# Patient Record
Sex: Female | Born: 1975 | Race: Black or African American | Hispanic: No | Marital: Married | Smoking: Never smoker
Health system: Southern US, Community
[De-identification: ages and names within clinical notes are randomized; demographics above are authoritative.]

## PROBLEM LIST (undated history)

## (undated) DIAGNOSIS — M1990 Unspecified osteoarthritis, unspecified site: Secondary | ICD-10-CM

## (undated) DIAGNOSIS — F32A Depression, unspecified: Secondary | ICD-10-CM

## (undated) HISTORY — DX: Unspecified osteoarthritis, unspecified site: M19.90

## (undated) HISTORY — DX: Depression, unspecified: F32.A

## (undated) HISTORY — PX: APPENDECTOMY: SHX54

## (undated) HISTORY — PX: BREAST SURGERY: SHX581

---

## 2018-11-11 ENCOUNTER — Ambulatory Visit: Payer: Self-pay | Admitting: Family Medicine

## 2018-11-11 LAB — HM PAP SMEAR

## 2019-01-04 ENCOUNTER — Other Ambulatory Visit: Payer: Self-pay

## 2019-01-05 ENCOUNTER — Ambulatory Visit (INDEPENDENT_AMBULATORY_CARE_PROVIDER_SITE_OTHER): Payer: 59 | Admitting: Family Medicine

## 2019-01-05 ENCOUNTER — Encounter: Payer: Self-pay | Admitting: Family Medicine

## 2019-01-05 VITALS — BP 102/78 | HR 88 | Temp 97.9°F | Wt 209.0 lb

## 2019-01-05 DIAGNOSIS — F329 Major depressive disorder, single episode, unspecified: Secondary | ICD-10-CM

## 2019-01-05 DIAGNOSIS — Z0000 Encounter for general adult medical examination without abnormal findings: Secondary | ICD-10-CM

## 2019-01-05 DIAGNOSIS — F419 Anxiety disorder, unspecified: Secondary | ICD-10-CM

## 2019-01-05 DIAGNOSIS — R635 Abnormal weight gain: Secondary | ICD-10-CM | POA: Diagnosis not present

## 2019-01-05 DIAGNOSIS — Z13220 Encounter for screening for lipoid disorders: Secondary | ICD-10-CM

## 2019-01-05 DIAGNOSIS — F32A Depression, unspecified: Secondary | ICD-10-CM | POA: Insufficient documentation

## 2019-01-05 LAB — BASIC METABOLIC PANEL
BUN: 10 mg/dL (ref 6–23)
CO2: 28 mEq/L (ref 19–32)
Calcium: 9.2 mg/dL (ref 8.4–10.5)
Chloride: 103 mEq/L (ref 96–112)
Creatinine, Ser: 0.74 mg/dL (ref 0.40–1.20)
GFR: 103.04 mL/min (ref >60.00)
Glucose, Bld: 81 mg/dL (ref 70–99)
Potassium: 3.9 mEq/L (ref 3.5–5.1)
Sodium: 138 mEq/L (ref 135–145)

## 2019-01-05 LAB — CBC WITH DIFFERENTIAL/PLATELET
Basophils Absolute: 0 10*3/uL (ref 0.0–0.1)
Basophils Relative: 0.6 % (ref 0.0–3.0)
Eosinophils Absolute: 0.1 10*3/uL (ref 0.0–0.7)
Eosinophils Relative: 2.2 % (ref 0.0–5.0)
HCT: 43.3 % (ref 36.0–46.0)
Hemoglobin: 14.5 g/dL (ref 12.0–15.0)
Lymphocytes Relative: 28.9 % (ref 12.0–46.0)
Lymphs Abs: 1.7 10*3/uL (ref 0.7–4.0)
MCHC: 33.4 g/dL (ref 30.0–36.0)
MCV: 95.3 fl (ref 78.0–100.0)
Monocytes Absolute: 0.4 10*3/uL (ref 0.1–1.0)
Monocytes Relative: 6.7 % (ref 3.0–12.0)
Neutro Abs: 3.6 10*3/uL (ref 1.4–7.7)
Neutrophils Relative %: 61.6 % (ref 43.0–77.0)
Platelets: 270 10*3/uL (ref 150.0–400.0)
RBC: 4.55 Mil/uL (ref 3.87–5.11)
RDW: 13.5 % (ref 11.5–15.5)
WBC: 5.9 10*3/uL (ref 4.0–10.5)

## 2019-01-05 LAB — HEMOGLOBIN A1C: Hgb A1c MFr Bld: 5.2 % (ref 4.6–6.5)

## 2019-01-05 LAB — LIPID PANEL
Cholesterol: 191 mg/dL (ref 0–200)
HDL: 70.3 mg/dL (ref >39.00)
LDL Cholesterol: 95 mg/dL (ref 0–99)
NonHDL: 120.76
Total CHOL/HDL Ratio: 3
Triglycerides: 127 mg/dL (ref 0.0–149.0)
VLDL: 25.4 mg/dL (ref 0.0–40.0)

## 2019-01-05 LAB — T4, FREE: Free T4: 0.87 ng/dL (ref 0.60–1.60)

## 2019-01-05 LAB — TSH: TSH: 2.14 u[IU]/mL (ref 0.35–4.50)

## 2019-01-05 NOTE — Patient Instructions (Signed)
Preventive Care 40-44 Years Old, Female Preventive care refers to visits with your health care provider and lifestyle choices that can promote health and wellness. This includes:  A yearly physical exam. This may also be called an annual well check.  Regular dental visits and eye exams.  Immunizations.  Screening for certain conditions.  Healthy lifestyle choices, such as eating a healthy diet, getting regular exercise, not using drugs or products that contain nicotine and tobacco, and limiting alcohol use. What can I expect for my preventive care visit? Physical exam Your health care provider will check your:  Height and weight. This may be used to calculate body mass index (BMI), which tells if you are at a healthy weight.  Heart rate and blood pressure.  Skin for abnormal spots. Counseling Your health care provider may ask you questions about your:  Alcohol, tobacco, and drug use.  Emotional well-being.  Home and relationship well-being.  Sexual activity.  Eating habits.  Work and work environment.  Method of birth control.  Menstrual cycle.  Pregnancy history. What immunizations do I need?  Influenza (flu) vaccine  This is recommended every year. Tetanus, diphtheria, and pertussis (Tdap) vaccine  You may need a Td booster every 10 years. Varicella (chickenpox) vaccine  You may need this if you have not been vaccinated. Zoster (shingles) vaccine  You may need this after age 60. Measles, mumps, and rubella (MMR) vaccine  You may need at least one dose of MMR if you were born in 1958 or later. You may also need a second dose. Pneumococcal conjugate (PCV13) vaccine  You may need this if you have certain conditions and were not previously vaccinated. Pneumococcal polysaccharide (PPSV23) vaccine  You may need one or two doses if you smoke cigarettes or if you have certain conditions. Meningococcal conjugate (MenACWY) vaccine  You may need this if you  have certain conditions. Hepatitis A vaccine  You may need this if you have certain conditions or if you travel or work in places where you may be exposed to hepatitis A. Hepatitis B vaccine  You may need this if you have certain conditions or if you travel or work in places where you may be exposed to hepatitis B. Haemophilus influenzae type b (Hib) vaccine  You may need this if you have certain conditions. Human papillomavirus (HPV) vaccine  If recommended by your health care provider, you may need three doses over 6 months. You may receive vaccines as individual doses or as more than one vaccine together in one shot (combination vaccines). Talk with your health care provider about the risks and benefits of combination vaccines. What tests do I need? Blood tests  Lipid and cholesterol levels. These may be checked every 5 years, or more frequently if you are over 50 years old.  Hepatitis C test.  Hepatitis B test. Screening  Lung cancer screening. You may have this screening every year starting at age 55 if you have a 30-pack-year history of smoking and currently smoke or have quit within the past 15 years.  Colorectal cancer screening. All adults should have this screening starting at age 50 and continuing until age 75. Your health care provider may recommend screening at age 45 if you are at increased risk. You will have tests every 1-10 years, depending on your results and the type of screening test.  Diabetes screening. This is done by checking your blood sugar (glucose) after you have not eaten for a while (fasting). You may have this   done every 1-3 years.  Mammogram. This may be done every 1-2 years. Talk with your health care provider about when you should start having regular mammograms. This may depend on whether you have a family history of breast cancer.  BRCA-related cancer screening. This may be done if you have a family history of breast, ovarian, tubal, or peritoneal  cancers.  Pelvic exam and Pap test. This may be done every 3 years starting at age 70. Starting at age 54, this may be done every 5 years if you have a Pap test in combination with an HPV test. Other tests  Sexually transmitted disease (STD) testing.  Bone density scan. This is done to screen for osteoporosis. You may have this scan if you are at high risk for osteoporosis. Follow these instructions at home: Eating and drinking  Eat a diet that includes fresh fruits and vegetables, whole grains, lean protein, and low-fat dairy.  Take vitamin and mineral supplements as recommended by your health care provider.  Do not drink alcohol if: ? Your health care provider tells you not to drink. ? You are pregnant, may be pregnant, or are planning to become pregnant.  If you drink alcohol: ? Limit how much you have to 0-1 drink a day. ? Be aware of how much alcohol is in your drink. In the [place] one drink equals one 12 oz bottle of beer (355 mL), one 5 oz glass of wine (148 mL), or one 1 oz glass of hard liquor (44 mL). Lifestyle  Take daily care of your teeth and gums.  Stay active. Exercise for at least 30 minutes on 5 or more days each week.  Do not use any products that contain nicotine or tobacco, such as cigarettes, e-cigarettes, and chewing tobacco. If you need help quitting, ask your health care provider.  If you are sexually active, practice safe sex. Use a condom or other form of birth control (contraception) in order to prevent pregnancy and STIs (sexually transmitted infections).  If told by your health care provider, take low-dose aspirin daily starting at age 80. What's next?  Visit your health care provider once a year for a well check visit.  Ask your health care provider how often you should have your eyes and teeth checked.  Stay up to date on all vaccines. This information is not intended to replace advice given to you by your health care provider. Make sure you  discuss any questions you have with your health care provider. Document Released: 02/02/2015 Document Revised: 09/17/2017 Document Reviewed: 09/17/2017 Elsevier Patient Education  2020 [place]  After being diagnosed with an anxiety disorder, you may be relieved to know why you have felt or behaved a certain way. It is natural to also feel overwhelmed about the treatment ahead and what it will mean for your life. With care and support, you can manage this condition and recover from it. How to cope with anxiety Dealing with stress Stress is your body's reaction to life changes and events, both good and bad. Stress can last just a few hours or it can be ongoing. Stress can play a major role in anxiety, so it is important to learn both how to cope with stress and how to think about it differently. Talk with your health care provider or a counselor to learn more about stress reduction. He or she may suggest some stress reduction techniques, such as:  Music therapy. This can include creating or listening  to music that you enjoy and that inspires you.  Mindfulness-based meditation. This involves being aware of your normal breaths, rather than trying to control your breathing. It can be done while sitting or walking.  Centering prayer. This is a kind of meditation that involves focusing on a word, phrase, or sacred image that is meaningful to you and that brings you peace.  Deep breathing. To do this, expand your stomach and inhale slowly through your nose. Hold your breath for 3-5 seconds. Then exhale slowly, allowing your stomach muscles to relax.  Self-talk. This is a skill where you identify thought patterns that lead to anxiety reactions and correct those thoughts.  Muscle relaxation. This involves tensing muscles then relaxing them. Choose a stress reduction technique that fits your lifestyle and personality. Stress reduction techniques take time and practice. Set  aside 5-15 minutes a day to do them. Therapists can offer training in these techniques. The training may be covered by some insurance plans. Other things you can do to manage stress include:  Keeping a stress diary. This can help you learn what triggers your stress and ways to control your response.  Thinking about how you respond to certain situations. You may not be able to control everything, but you can control your reaction.  Making time for activities that help you relax, and not feeling guilty about spending your time in this way. Therapy combined with coping and stress-reduction skills provides the best chance for successful treatment. Medicines Medicines can help ease symptoms. Medicines for anxiety include:  Anti-anxiety drugs.  Antidepressants.  Beta-blockers. Medicines may be used as the main treatment for anxiety disorder, along with therapy, or if other treatments are not working. Medicines should be prescribed by a health care provider. Relationships Relationships can play a big part in helping you recover. Try to spend more time connecting with trusted friends and family members. Consider going to couples counseling, taking family education classes, or going to family therapy. Therapy can help you and others better understand the condition. How to recognize changes in your condition Everyone has a different response to treatment for anxiety. Recovery from anxiety happens when symptoms decrease and stop interfering with your daily activities at home or work. This may mean that you will start to:  Have better concentration and focus.  Sleep better.  Be less irritable.  Have more energy.  Have improved memory. It is important to recognize when your condition is getting worse. Contact your health care provider if your symptoms interfere with home or work and you do not feel like your condition is improving. Where to find help and support: You can get help and support from  these sources:  Self-help groups.  Online and [employer].  A trusted spiritual leader.  Couples counseling.  Family education classes.  Family therapy. Follow these instructions at home:  Eat a healthy diet that includes plenty of vegetables, fruits, whole grains, low-fat dairy products, and lean protein. Do not eat a lot of foods that are high in solid fats, added sugars, or salt.  Exercise. Most adults should do the following: ? Exercise for at least 150 minutes each week. The exercise should increase your heart rate and make you sweat (moderate-intensity exercise). ? Strengthening exercises at least twice a week.  Cut down on caffeine, tobacco, alcohol, and other potentially harmful substances.  Get the right amount and quality of sleep. Most adults need 7-9 hours of sleep each night.  Make choices that simplify your life.  Take over-the-counter and prescription medicines only as told by your health care provider.  Avoid caffeine, alcohol, and certain over-the-counter cold medicines. These may make you feel worse. Ask your pharmacist which medicines to avoid.  Keep all follow-up visits as told by your health care provider. This is important. Questions to ask your health care provider  Would I benefit from therapy?  How often should I follow up with a health care provider?  How long do I need to take medicine?  Are there any long-term side effects of my medicine?  Are there any alternatives to taking medicine? Contact a health care provider if:  You have a hard time staying focused or finishing daily tasks.  You spend many hours a day feeling worried about everyday life.  You become exhausted by worry.  You start to have headaches, feel tense, or have nausea.  You urinate more than normal.  You have diarrhea. Get help right away if:  You have a racing heart and shortness of breath.  You have thoughts of hurting yourself or others. If you  ever feel like you may hurt yourself or others, or have thoughts about taking your own life, get help right away. You can go to your nearest emergency department or call:  Your local emergency services (911 in the [place]  A suicide crisis helpline, such as the [place] at (778)460-1154. This is open 24-hours a day. Summary  Taking steps to deal with stress can help calm you.  Medicines cannot cure anxiety disorders, but they can help ease symptoms.  Family, friends, and partners can play a big part in helping you recover from an anxiety disorder. This information is not intended to replace advice given to you by your health care provider. Make sure you discuss any questions you have with your health care provider. Document Released: 01/01/2016 Document Revised: 12/19/2016 Document Reviewed: 01/01/2016 Elsevier Patient Education  2020 [place] With Depression Everyone experiences occasional disappointment, sadness, and loss in their lives. When you are feeling down, blue, or sad for at least 2 weeks in a row, it may mean that you have depression. Depression can affect your thoughts and feelings, relationships, daily activities, and physical health. It is caused by changes in the way your brain functions. If you receive a diagnosis of depression, your health care provider will tell you which type of depression you have and what treatment options are available to you. If you are living with depression, there are ways to help you recover from it and also ways to prevent it from coming back. How to cope with lifestyle changes Coping with stress     Stress is your body's reaction to life changes and events, both good and bad. Stressful situations may include:  Getting married.  The death of a spouse.  Losing a job.  Retiring.  Having a baby. Stress can last just a few hours or it can be ongoing. Stress can play a major role in depression, so it  is important to learn both how to cope with stress and how to think about it differently. Talk with your health care provider or a counselor if you would like to learn more about stress reduction. He or she may suggest some stress reduction techniques, such as:  Music therapy. This can include creating music or listening to music. Choose music that you enjoy and that inspires you.  Mindfulness-based meditation. This kind of meditation can be done while sitting or  walking. It involves being aware of your normal breaths, rather than trying to control your breathing.  Centering prayer. This is a kind of meditation that involves focusing on a spiritual word or phrase. Choose a word, phrase, or sacred image that is meaningful to you and that brings you peace.  Deep breathing. To do this, expand your stomach and inhale slowly through your nose. Hold your breath for 3-5 seconds, then exhale slowly, allowing your stomach muscles to relax.  Muscle relaxation. This involves intentionally tensing muscles then relaxing them. Choose a stress reduction technique that fits your lifestyle and personality. Stress reduction techniques take time and practice to develop. Set aside 5-15 minutes a day to do them. Therapists can offer training in these techniques. The training may be covered by some insurance plans. Other things you can do to manage stress include:  Keeping a stress diary. This can help you learn what triggers your stress and ways to control your response.  Understanding what your limits are and saying no to requests or events that lead to a schedule that is too full.  Thinking about how you respond to certain situations. You may not be able to control everything, but you can control how you react.  Adding humor to your life by watching funny films or TV shows.  Making time for activities that help you relax and not feeling guilty about spending your time this way.  Medicines Your health care  provider may suggest certain medicines if he or she feels that they will help improve your condition. Avoid using alcohol and other substances that may prevent your medicines from working properly (may interact). It is also important to:  Talk with your pharmacist or health care provider about all the medicines that you take, their possible side effects, and what medicines are safe to take together.  Make it your goal to take part in all treatment decisions (shared decision-making). This includes giving input on the side effects of medicines. It is best if shared decision-making with your health care provider is part of your total treatment plan. If your health care provider prescribes a medicine, you may not notice the full benefits of it for 4-8 weeks. Most people who are treated for depression need to be on medicine for at least 6-12 months after they feel better. If you are taking medicines as part of your treatment, do not stop taking medicines without first talking to your health care provider. You may need to have the medicine slowly decreased (tapered) over time to decrease the risk of harmful side effects. Relationships Your health care provider may suggest family therapy along with individual therapy and drug therapy. While there may not be family problems that are causing you to feel depressed, it is still important to make sure your family learns as much as they can about your mental health. Having your family's support can help make your treatment successful. How to recognize changes in your condition Everyone has a different response to treatment for depression. Recovery from major depression happens when you have not had signs of major depression for two months. This may mean that you will start to:  Have more interest in doing activities.  Feel less hopeless than you did 2 months ago.  Have more energy.  Overeat less often, or have better or improving appetite.  Have better  concentration. Your health care provider will work with you to decide the next steps in your recovery. It is also important to recognize  when your condition is getting worse. Watch for these signs:  Having fatigue or low energy.  Eating too much or too little.  Sleeping too much or too little.  Feeling restless, agitated, or hopeless.  Having trouble concentrating or making decisions.  Having unexplained physical complaints.  Feeling irritable, angry, or aggressive. Get help as soon as you or your family members notice these symptoms coming back. How to get support and help from others How to talk with friends and family members about your condition  Talking to friends and family members about your condition can provide you with one way to get support and guidance. Reach out to trusted friends or family members, explain your symptoms to them, and let them know that you are working with a health care provider to treat your depression. Financial resources Not all insurance plans cover mental health care, so it is important to check with your insurance carrier. If paying for co-pays or counseling services is a problem, search for a local or county mental health care center. They may be able to offer public mental health care services at low or no cost when you are not able to see a private health care provider. If you are taking medicine for depression, you may be able to get the generic form, which may be less expensive. Some makers of prescription medicines also offer help to patients who cannot afford the medicines they need. Follow these instructions at home:   Get the right amount and quality of sleep.  Cut down on using caffeine, tobacco, alcohol, and other potentially harmful substances.  Try to exercise, such as walking or lifting small weights.  Take over-the-counter and prescription medicines only as told by your health care provider.  Eat a healthy diet that includes plenty  of vegetables, fruits, whole grains, low-fat dairy products, and lean protein. Do not eat a lot of foods that are high in solid fats, added sugars, or salt.  Keep all follow-up visits as told by your health care provider. This is important. Contact a health care provider if:  You stop taking your antidepressant medicines, and you have any of these symptoms: ? Nausea. ? Headache. ? Feeling lightheaded. ? Chills and body aches. ? Not being able to sleep (insomnia).  You or your friends and family think your depression is getting worse. Get help right away if:  You have thoughts of hurting yourself or others. If you ever feel like you may hurt yourself or others, or have thoughts about taking your own life, get help right away. You can go to your nearest emergency department or call:  Your local emergency services (911 in the [place]  A suicide crisis helpline, such as the [place] at 870-418-5797. This is open 24-hours a day. Summary  If you are living with depression, there are ways to help you recover from it and also ways to prevent it from coming back.  Work with your health care team to create a management plan that includes counseling, stress management techniques, and healthy lifestyle habits. This information is not intended to replace advice given to you by your health care provider. Make sure you discuss any questions you have with your health care provider. Document Released: 12/10/2015 Document Revised: 04/30/2018 Document Reviewed: 12/10/2015 Elsevier Patient Education  2020 [place] for [employer] Calories are units of energy. Your body needs a certain amount of calories from food to keep you going throughout the day. When  you eat more calories than your body needs, your body stores the extra calories as fat. When you eat fewer calories than your body needs, your body burns fat to get the energy it needs. Calorie  counting means keeping track of how many calories you eat and drink each day. Calorie counting can be helpful if you need to lose weight. If you make sure to eat fewer calories than your body needs, you should lose weight. Ask your health care provider what a healthy weight is for you. For calorie counting to work, you will need to eat the right number of calories in a day in order to lose a healthy amount of weight per week. A dietitian can help you determine how many calories you need in a day and will give you suggestions on how to reach your calorie goal.  A healthy amount of weight to lose per week is usually 1-2 lb (0.5-0.9 kg). This usually means that your daily calorie intake should be reduced by 500-750 calories.  Eating 1,200 - 1,500 calories per day can help most women lose weight.  Eating 1,500 - 1,800 calories per day can help most men lose weight. What is my plan? My goal is to have __________ calories per day. If I have this many calories per day, I should lose around __________ pounds per week. What do I need to know about calorie counting? In order to meet your daily calorie goal, you will need to:  Find out how many calories are in each food you would like to eat. Try to do this before you eat.  Decide how much of the food you plan to eat.  Write down what you ate and how many calories it had. Doing this is called keeping a food log. To successfully lose weight, it is important to balance calorie counting with a healthy lifestyle that includes regular activity. Aim for 150 minutes of moderate exercise (such as walking) or 75 minutes of vigorous exercise (such as running) each week. Where do I find calorie information?  The number of calories in a food can be found on a Nutrition Facts label. If a food does not have a Nutrition Facts label, try to look up the calories online or ask your dietitian for help. Remember that calories are listed per serving. If you choose to have  more than one serving of a food, you will have to multiply the calories per serving by the amount of servings you plan to eat. For example, the label on a package of bread might say that a serving size is 1 slice and that there are 90 calories in a serving. If you eat 1 slice, you will have eaten 90 calories. If you eat 2 slices, you will have eaten 180 calories. How do I keep a food log? Immediately after each meal, record the following information in your food log:  What you ate. Don't forget to include toppings, sauces, and other extras on the food.  How much you ate. This can be measured in cups, ounces, or number of items.  How many calories each food and drink had.  The total number of calories in the meal. Keep your food log near you, such as in a small notebook in your pocket, or use a mobile app or website. Some programs will calculate calories for you and show you how many calories you have left for the day to meet your goal. What are some calorie counting tips?  Use your calories on foods and drinks that will fill you up and not leave you hungry: ? Some examples of foods that fill you up are nuts and nut butters, vegetables, lean proteins, and high-fiber foods like whole grains. High-fiber foods are foods with more than 5 g fiber per serving. ? Drinks such as sodas, specialty coffee drinks, alcohol, and juices have a lot of calories, yet do not fill you up.  Eat nutritious foods and avoid empty calories. Empty calories are calories you get from foods or beverages that do not have many vitamins or protein, such as candy, sweets, and soda. It is better to have a nutritious high-calorie food (such as an avocado) than a food with few nutrients (such as a bag of chips).  Know how many calories are in the foods you eat most often. This will help you calculate calorie counts faster.  Pay attention to calories in drinks. Low-calorie drinks include water and unsweetened drinks.  Pay  attention to nutrition labels for "low fat" or "fat free" foods. These foods sometimes have the same amount of calories or more calories than the full fat versions. They also often have added sugar, starch, or salt, to make up for flavor that was removed with the fat.  Find a way of tracking calories that works for you. Get creative. Try different apps or programs if writing down calories does not work for you. What are some portion control tips?  Know how many calories are in a serving. This will help you know how many servings of a certain food you can have.  Use a measuring cup to measure serving sizes. You could also try weighing out portions on a kitchen scale. With time, you will be able to estimate serving sizes for some foods.  Take some time to put servings of different foods on your favorite plates, bowls, and cups so you know what a serving looks like.  Try not to eat straight from a bag or box. Doing this can lead to overeating. Put the amount you would like to eat in a cup or on a plate to make sure you are eating the right portion.  Use smaller plates, glasses, and bowls to prevent overeating.  Try not to multitask (for example, watch TV or use your computer) while eating. If it is time to eat, sit down at a table and enjoy your food. This will help you to know when you are full. It will also help you to be aware of what you are eating and how much you are eating. What are tips for following this plan? Reading food labels  Check the calorie count compared to the serving size. The serving size may be smaller than what you are used to eating.  Check the source of the calories. Make sure the food you are eating is high in vitamins and protein and low in saturated and trans fats. Shopping  Read nutrition labels while you shop. This will help you make healthy decisions before you decide to purchase your food.  Make a grocery list and stick to it. Cooking  Try to cook your  favorite foods in a healthier way. For example, try baking instead of frying.  Use low-fat dairy products. Meal planning  Use more fruits and vegetables. Half of your plate should be fruits and vegetables.  Include lean proteins like poultry and fish. How do I count calories when eating out?  Ask for smaller portion sizes.  Consider sharing  an entree and sides instead of getting your own entree.  If you get your own entree, eat only half. Ask for a box at the beginning of your meal and put the rest of your entree in it so you are not tempted to eat it.  If calories are listed on the menu, choose the lower calorie options.  Choose dishes that include vegetables, fruits, whole grains, low-fat dairy products, and lean protein.  Choose items that are boiled, broiled, grilled, or steamed. Stay away from items that are buttered, battered, fried, or served with cream sauce. Items labeled "crispy" are usually fried, unless stated otherwise.  Choose water, low-fat milk, unsweetened iced tea, or other drinks without added sugar. If you want an alcoholic beverage, choose a lower calorie option such as a glass of wine or light beer.  Ask for dressings, sauces, and syrups on the side. These are usually high in calories, so you should limit the amount you eat.  If you want a salad, choose a garden salad and ask for grilled meats. Avoid extra toppings like bacon, cheese, or fried items. Ask for the dressing on the side, or ask for olive oil and vinegar or lemon to use as dressing.  Estimate how many servings of a food you are given. For example, a serving of cooked rice is  cup or about the size of half a baseball. Knowing serving sizes will help you be aware of how much food you are eating at restaurants. The list below tells you how big or small some common portion sizes are based on everyday objects: ? 1 oz--4 stacked dice. ? 3 oz--1 deck of cards. ? 1 tsp--1 die. ? 1 Tbsp-- a ping-pong  ball. ? 2 Tbsp--1 ping-pong ball. ?  cup-- baseball. ? 1 cup--1 baseball. Summary  Calorie counting means keeping track of how many calories you eat and drink each day. If you eat fewer calories than your body needs, you should lose weight.  A healthy amount of weight to lose per week is usually 1-2 lb (0.5-0.9 kg). This usually means reducing your daily calorie intake by 500-750 calories.  The number of calories in a food can be found on a Nutrition Facts label. If a food does not have a Nutrition Facts label, try to look up the calories online or ask your dietitian for help.  Use your calories on foods and drinks that will fill you up, and not on foods and drinks that will leave you hungry.  Use smaller plates, glasses, and bowls to prevent overeating. This information is not intended to replace advice given to you by your health care provider. Make sure you discuss any questions you have with your health care provider. Document Released: 01/06/2005 Document Revised: 09/25/2017 Document Reviewed: 12/07/2015 Elsevier Patient Education  2020 [employer].  Exercising to Lose Weight Exercise is structured, repetitive physical activity to improve fitness and health. Getting regular exercise is important for everyone. It is especially important if you are overweight. Being overweight increases your risk of heart disease, stroke, diabetes, high blood pressure, and several types of cancer. Reducing your calorie intake and exercising can help you lose weight. Exercise is usually categorized as moderate or vigorous intensity. To lose weight, most people need to do a certain amount of moderate-intensity or vigorous-intensity exercise each week. Moderate-intensity exercise  Moderate-intensity exercise is any activity that gets you moving enough to burn at least three times more energy (calories) than if you were sitting. Examples of  moderate exercise include:  Walking a mile in 15  minutes.  Doing light yard work.  Biking at an easy pace. Most people should get at least 150 minutes (2 hours and 30 minutes) a week of moderate-intensity exercise to maintain their body weight. Vigorous-intensity exercise Vigorous-intensity exercise is any activity that gets you moving enough to burn at least six times more calories than if you were sitting. When you exercise at this intensity, you should be working hard enough that you are not able to carry on a conversation. Examples of vigorous exercise include:  Running.  Playing a team sport, such as football, basketball, and soccer.  Jumping rope. Most people should get at least 75 minutes (1 hour and 15 minutes) a week of vigorous-intensity exercise to maintain their body weight. How can exercise affect me? When you exercise enough to burn more calories than you eat, you lose weight. Exercise also reduces body fat and builds muscle. The more muscle you have, the more calories you burn. Exercise also:  Improves mood.  Reduces stress and tension.  Improves your overall fitness, flexibility, and endurance.  Increases bone strength. The amount of exercise you need to lose weight depends on:  Your age.  The type of exercise.  Any health conditions you have.  Your overall physical ability. Talk to your health care provider about how much exercise you need and what types of activities are safe for you. What actions can I take to lose weight? Nutrition   Make changes to your diet as told by your health care provider or diet and nutrition specialist (dietitian). This may include: ? Eating fewer calories. ? Eating more protein. ? Eating less unhealthy fats. ? Eating a diet that includes fresh fruits and vegetables, whole grains, low-fat dairy products, and lean protein. ? Avoiding foods with added fat, salt, and sugar.  Drink plenty of water while you exercise to prevent dehydration or heat stroke. Activity  Choose an  activity that you enjoy and set realistic goals. Your health care provider can help you make an exercise plan that works for you.  Exercise at a moderate or vigorous intensity most days of the week. ? The intensity of exercise may vary from person to person. You can tell how intense a workout is for you by paying attention to your breathing and heartbeat. Most people will notice their breathing and heartbeat get faster with more intense exercise.  Do resistance training twice each week, such as: ? Push-ups. ? Sit-ups. ? Lifting weights. ? Using resistance bands.  Getting short amounts of exercise can be just as helpful as long structured periods of exercise. If you have trouble finding time to exercise, try to include exercise in your daily routine. ? Get up, stretch, and walk around every 30 minutes throughout the day. ? Go for a walk during your lunch break. ? Park your car farther away from your destination. ? If you take public transportation, get off one stop early and walk the rest of the way. ? Make phone calls while standing up and walking around. ? Take the stairs instead of elevators or escalators.  Wear comfortable clothes and shoes with good support.  Do not exercise so much that you hurt yourself, feel dizzy, or get very short of breath. Where to find more information  [place] Department of Health and Human Services: [employer]  Centers for Disease Control and Prevention (CDC): http://www.wolf.info/ Contact a health care provider:  Before starting a new exercise program.  If you have questions or concerns about your weight.  If you have a medical problem that keeps you from exercising. Get help right away if you have any of the following while exercising:  Injury.  Dizziness.  Difficulty breathing or shortness of breath that does not go away when you stop exercising.  Chest pain.  Rapid heartbeat. Summary  Being overweight increases your risk of heart disease, stroke,  diabetes, high blood pressure, and several types of cancer.  Losing weight happens when you burn more calories than you eat.  Reducing the amount of calories you eat in addition to getting regular moderate or vigorous exercise each week helps you lose weight. This information is not intended to replace advice given to you by your health care provider. Make sure you discuss any questions you have with your health care provider. Document Released: 02/08/2010 Document Revised: 01/19/2017 Document Reviewed: 01/19/2017 Elsevier Patient Education  2020 [employer].

## 2019-01-05 NOTE — Progress Notes (Signed)
Subjective:     Rebekah Baker is a 43 y.o. female and is here for a comprehensive physical exam. The patient reports problems - weight gain and anxiety.  Pt endorses gaining 50 pounds in 2017 with the birth of her third child.  Pt states with unable to lose weight.  Pt admits overall eating healthy, but not eating consistently.  At times will be busy at work and forget to eat lunch.  Endorses stress eating/snacking.  Not currently exercising.  Pt also notes history of depression.  Was on Wellbutrin over 1 year ago.  Pt notes increased anxiety 1 week prior to menses.  Was on Xanax in the past.  Pt was on OCPs in the past but experienced a AUB.  Pt states she will need medication 2/2 increased anxiety on most days.  Pt states she has not had time to do things for herself.  Notes increased stress 2/2 COVID 19 pandemic.  Allergies: NKDA  Past surgical history: Breast biopsy 1998 Appendectomy 1984  Social history:  Pt is married.  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  Pt has 3 children. Her last child was born in 2017.  Pt is originally from [place]. Pt endorses social alcohol use.  Pt denies tobacco and drug use.  Patient does not get influenza vaccines as they have made her sick in the past.  Pt denies significant past family history.  Pt states her half brother has schizophrenia and a paternal half uncle had colon cancer.  Social History   Socioeconomic History  . Marital status: Married    Spouse name: Not on file  . Number of children: Not on file  . Years of education: Not on file  . Highest education level: Not on file  Occupational History  . Not on file  Tobacco Use  . Smoking status: Never Smoker  . Smokeless tobacco: Never Used  Substance and Sexual Activity  . Alcohol use: Yes  . Drug use: Never  . Sexual activity: Yes  Other Topics Concern  . Not on file  Social History Narrative  . Not on file   Social Determinants of Health    Financial Resource Strain:   . Difficulty of Paying Living Expenses: Not on file  Food Insecurity:   . Worried About Programme researcher, broadcasting/film/video in the Last Year: Not on file  . Ran Out of Food in the Last Year: Not on file  Transportation Needs:   . Lack of Transportation (Medical): Not on file  . Lack of Transportation (Non-Medical): Not on file  Physical Activity:   . Days of Exercise per Week: Not on file  . Minutes of Exercise per Session: Not on file  Stress:   . Feeling of Stress : Not on file  Social Connections:   . Frequency of Communication with Friends and Family: Not on file  . Frequency of Social Gatherings with Friends and Family: Not on file  . Attends Religious Services: Not on file  . Active Member of Clubs or Organizations: Not on file  . Attends Banker Meetings: Not on file  . Marital Status: Not on file  Intimate Partner Violence:   . Fear of Current or Ex-Partner: Not on file  . Emotionally Abused: Not on file  . Physically Abused: Not on file  . Sexually Abused: Not on file   Health Maintenance  Topic Date Due  . HIV Screening  10/02/1989  . TETANUS/TDAP  10/02/1993  . PAP SMEAR-Modifier  10/03/1995  . INFLUENZA VACCINE  08/21/2018    The following portions of the patient's history were reviewed and updated as appropriate: allergies, current medications, past family history, past medical history, past social history, past surgical history and problem list.  Review of Systems Pertinent items noted in HPI and remainder of comprehensive ROS otherwise negative.   Objective:    BP 102/78 (BP Location: Left Arm, Patient Position: Sitting, Cuff Size: Large)   Pulse 88   Temp 97.9 F (36.6 C) (Temporal)   Wt 209 lb (94.8 kg)   LMP 01/01/2019   SpO2 98%  General appearance: alert, cooperative and no distress Head: Normocephalic, without obvious abnormality, atraumatic Eyes: conjunctivae/corneas clear. PERRL, EOM's intact. Fundi benign. Ears:  normal TM's and external ear canals both ears Nose: Nares normal. Septum midline. Mucosa normal. No drainage or sinus tenderness. Throat: lips, mucosa, and tongue normal; teeth and gums normal Neck: no adenopathy, no carotid bruit, no JVD, supple, symmetrical, trachea midline and thyroid not enlarged, symmetric, no tenderness/mass/nodules Lungs: clear to auscultation bilaterally Heart: regular rate and rhythm, S1, S2 normal, no murmur, click, rub or gallop Abdomen: soft, non-tender; bowel sounds normal; no masses,  no organomegaly Extremities: extremities normal, atraumatic, no cyanosis or edema Pulses: 2+ and symmetric Skin: Skin color, texture, turgor normal. No rashes or lesions Lymph nodes: Cervical, supraclavicular, and axillary nodes normal. Neurologic: Alert and oriented X 3, normal strength and tone. Normal symmetric reflexes. Normal coordination and gait    Assessment:    Healthy female exam with h/o depression, anxiety, and weight gain.     Plan:     Anticipatory guidance given including wearing seatbelts, smoke detectors in the home, increasing physical activity, increasing p.o. intake of water and vegetables. -will obtain labs -pap and mammogram up to date.  Done 10/2018 with OB/Gyn -declines influenza vaccine -given handouts -next CPE in 1 yr See After Visit Summary for Counseling Recommendations    Anxiety and depression  -PHQ-9 score 0 -GAD-7 score 7 -Given handouts -Encouraged to continue counseling -Discussed ways to decrease stress. -Pt adamant she needs medication for anxiety.  Discussed obtaining lab results then making follow-up appointment to further review. - Plan: TSH, T4, Free  Weight gain -Discussed eating meals at regular examples, decreasing portions, eating healthy snacks. -Advised to decrease caffeine intake -Discussed increasing physical activity -Given handouts - Plan: TSH, T4, Free, Hemoglobin A1c  Screening for cholesterol level - Plan:  Lipid Panel  Follow-up in 2-4 weeks, sooner if needed  Grier Mitts, MD  This note is not being shared with the patient for the following reason: To prevent harm (release of this note would result in harm to the life or physical safety of the patient or another).

## 2019-01-19 ENCOUNTER — Telehealth (INDEPENDENT_AMBULATORY_CARE_PROVIDER_SITE_OTHER): Payer: 59 | Admitting: Family Medicine

## 2019-01-19 DIAGNOSIS — F419 Anxiety disorder, unspecified: Secondary | ICD-10-CM | POA: Diagnosis not present

## 2019-01-19 DIAGNOSIS — F329 Major depressive disorder, single episode, unspecified: Secondary | ICD-10-CM

## 2019-01-19 NOTE — Progress Notes (Signed)
Virtual Visit via Video Note  I connected with Rebekah Baker on 01/19/19 at  3:30 PM EST by a video enabled telemedicine application 2/2 [id number] pandemic and verified that I am speaking with the correct person using two identifiers.  Location patient: pt is in the car Location provider:work or home office Persons participating in the virtual visit: patient, provider.  Pt's husband and 3 kids also in the car.  I discussed the limitations of evaluation and management by telemedicine and the availability of in person appointments. The patient expressed understanding and agreed to proceed.   HPI: Pt is a 43 yo female with pmh sig for anxiety and depression.  Pt seen for f/u.  States "things are the same".  Notes increasing anxiety as near menses.  States sleep is not good as she has a toddler that wakes up several times per night.  Pt notes finding out her brother in law has COVID-66, his wife is pregnant has added to her stress.  Pt has yet to find a new counselor that is doing e-visits.  Previous counselor seeing pts in person only.  At last OFV, pt states she feels as if she was not heard and suggestion were not made based on "where she is in life".  Pt states she was understanding that this provider had to do her "due diligence" in regards to ordering labs, but felt frustrated at the suggestions that were given to help with anxiety as she does not have time to do extra activities.  Pt felt like xanax should have been prescribed.   Pt advise that pt's schedule was taken into consideration when suggestion regarding anxiety were made.  Pt advised that taking 10-15 min out of the day was possible.  Pt states this is the first she is hearing of this and feels like that is a more realistic suggestion.  Pt advised that to consider finding a new provider who can better address her health care needs.  ROS: See pertinent positives and negatives per HPI.  No past medical history on file.  Past Surgical  History:  Procedure Laterality Date  . APPENDECTOMY    . BREAST SURGERY      No family history on file.   No current outpatient medications on file.  EXAM:  VITALS per patient if applicable:  RR between 12-20 bpm  GENERAL: alert, oriented, appears well and in no acute distress  HEENT: atraumatic, conjunctiva clear, no obvious abnormalities on inspection of external nose and ears  NECK: normal movements of the head and neck  LUNGS: on inspection no signs of respiratory distress, breathing rate appears normal, no obvious gross SOB, gasping or wheezing  CV: no obvious cyanosis  MS: moves all visible extremities without noticeable abnormality  PSYCH/NEURO: pleasant and cooperative, no obvious depression or anxiety, speech and thought processing grossly intact  ASSESSMENT AND PLAN:  Discussed the following assessment and plan:  Anxiety and depression -stable -pt encouraged to find a counselor as there is increased benefit with medication and counseling together. -given the above mentioned disconnect in the patient-provider relationship, pt advised to consider finding a new provider.  I discussed the assessment and treatment plan with the patient. The patient was provided an opportunity to ask questions and all were answered. The patient agreed with the plan and demonstrated an understanding of the instructions.   The patient was advised to call back or seek an in-person evaluation if the symptoms worsen or if the condition fails to improve as anticipated.  Deeann Saint, MD   This note is not being shared with the patient for the following reason: To prevent harm (release of this note would result in harm to the life or physical safety of the patient or another).

## 2019-04-07 ENCOUNTER — Ambulatory Visit: Payer: 59 | Attending: Internal Medicine

## 2019-05-10 ENCOUNTER — Telehealth (INDEPENDENT_AMBULATORY_CARE_PROVIDER_SITE_OTHER): Payer: 59 | Admitting: Family Medicine

## 2019-05-10 ENCOUNTER — Encounter: Payer: Self-pay | Admitting: Family Medicine

## 2019-05-10 DIAGNOSIS — Z7189 Other specified counseling: Secondary | ICD-10-CM | POA: Diagnosis not present

## 2019-05-10 DIAGNOSIS — J3489 Other specified disorders of nose and nasal sinuses: Secondary | ICD-10-CM | POA: Diagnosis not present

## 2019-05-10 DIAGNOSIS — Z7185 Encounter for immunization safety counseling: Secondary | ICD-10-CM

## 2019-05-10 DIAGNOSIS — R5383 Other fatigue: Secondary | ICD-10-CM | POA: Diagnosis not present

## 2019-05-10 DIAGNOSIS — R52 Pain, unspecified: Secondary | ICD-10-CM

## 2019-05-10 NOTE — Progress Notes (Signed)
Virtual Visit via Video Note  I connected with Rebekah Baker  on 05/10/19 at 10:20 AM EDT by a video enabled telemedicine application and verified that I am speaking with the correct person using two identifiers.  Location patient: home Location provider:work or home office Persons participating in the virtual visit: patient, provider  I discussed the limitations of evaluation and management by telemedicine and the availability of in person appointments. The patient expressed understanding and agreed to proceed.   HPI:  Acute visit for  -got the mrna COVID19 vaccine last week -she started to get a headache that evening, then developed fatigue, arm pain, body aches for 2-3 days, then felt better for a few days -but then after doing a pretty intense workout, she has had some body aches and low energy the last few days -denies persistent headaches, NVD, fevers, SOB -she has had some intermittent sinus issues but not new  For her during the allergy season -no known sick contacts, tick exposures, exposures outside of household without masks or social distancing -her children do go to school -she was worried if she could have picked up covid   ROS: See pertinent positives and negatives per HPI.  History reviewed. No pertinent past medical history.  Past Surgical History:  Procedure Laterality Date  . APPENDECTOMY    . BREAST SURGERY      History reviewed. No pertinent family history.  SOCIAL HX: see hpi   Current Outpatient Medications:  .  buPROPion (WELLBUTRIN SR) 200 MG 12 hr tablet, Take 200 mg by mouth daily., Disp: , Rfl:   EXAM:  VITALS per patient if applicable:  GENERAL: alert, oriented, appears well and in no acute distress  HEENT: atraumatic, conjunttiva clear, no obvious abnormalities on inspection of external nose and ears  NECK: normal movements of the head and neck  LUNGS: on inspection no signs of respiratory distress, breathing rate appears normal, no obvious  gross SOB, gasping or wheezing  CV: no obvious cyanosis  MS: moves all visible extremities without noticeable abnormality  PSYCH/NEURO: pleasant and cooperative, no obvious depression or anxiety, speech and thought processing grossly intact  ASSESSMENT AND PLAN:  Discussed the following assessment and plan:  Body aches  Other fatigue  Sinus pressure  Vaccine counseling  -we discussed possible serious and likely etiologies, options for evaluation and workup, limitations of telemedicine visit vs in person visit, treatment, treatment risks and precautions. Pt prefers to treat via telemedicine empirically rather then risking or undertaking an in person visit at this moment.  Query prolonged response to the covid vaccine vs new viral illness vs other. HAve seen some variation in duration of side effects to the COVID19 vaccine and some folks feeling worse after intense workouts in the week or two following the vaccine. Also, her kids are in school, so there is the possibility of a viral illness or even COVID. Discussion optins and limitations for testing, home isolation, symptoms, etc. She agrees to seek testing. Trial symptomatic care with aleve or tylenol and plenty of water, healthy diet, vit D and C adequate amounts. Given could also be something entirely different or more ominous did advise seeking in person care if worsening, new symptoms arise, or if is not improving with treatment. She agrees. Counseled on vaccine side effects, contraindications and advised reporting to the vaccine reporting system.   I discussed the assessment and treatment plan with the patient. The patient was provided an opportunity to ask questions and all were answered. The patient agreed  with the plan and demonstrated an understanding of the instructions.   The patient was advised to call back or seek an in-person evaluation if the symptoms worsen or if the condition fails to improve as anticipated.   Lucretia Kern, DO

## 2019-06-30 ENCOUNTER — Telehealth (INDEPENDENT_AMBULATORY_CARE_PROVIDER_SITE_OTHER): Payer: 59 | Admitting: Family Medicine

## 2019-06-30 ENCOUNTER — Encounter: Payer: Self-pay | Admitting: Family Medicine

## 2019-06-30 VITALS — Wt 182.0 lb

## 2019-06-30 DIAGNOSIS — R42 Dizziness and giddiness: Secondary | ICD-10-CM

## 2019-06-30 DIAGNOSIS — R0789 Other chest pain: Secondary | ICD-10-CM | POA: Diagnosis not present

## 2019-06-30 NOTE — Progress Notes (Signed)
Virtual Visit via Video Note  I connected with Rebekah Baker  on 06/30/19 at  3:00 PM EDT by a video enabled telemedicine application and verified that I am speaking with the correct person using two identifiers.  Location patient: home, [place] Location provider:work or home office Persons participating in the virtual visit: patient, provider  I discussed the limitations of evaluation and management by telemedicine and the availability of in person appointments. The patient expressed understanding and agreed to proceed.   HPI:  Acute visit for dizziness: -intermittent for several months and in the past some too - but seems to be more noticeable this year -usually only notices it when has been bending over gardening and goes to stand up and she will feel a little dizzy briefly -otherwise feels fine -does not notice any weakness, syncope, HA, CP, SOB, palpitations, vision changes or nausea with these episodes - two nights ago had some TTP, reproducable chest pain in the center of her chest from where her sports bra hits her chest (she gets this sometimes, only when she wears this bra, can ache for several days) -she had one episode of dizziness recently when rolled over and went to stand up after lying down  ROS: See pertinent positives and negatives per HPI.  History reviewed. No pertinent past medical history.  Past Surgical History:  Procedure Laterality Date   APPENDECTOMY     BREAST SURGERY      History reviewed. No pertinent family history.  SOCIAL HX: see hpi   Current Outpatient Medications:    buPROPion (WELLBUTRIN SR) 200 MG 12 hr tablet, Take 200 mg by mouth daily., Disp: , Rfl:   EXAM:  VITALS per patient if applicable:  GENERAL: alert, oriented, appears well and in no acute distress  HEENT: atraumatic, conjunttiva clear, no obvious abnormalities on inspection of external nose and ears  NECK: normal movements of the head and neck  LUNGS: on inspection no signs of  respiratory distress, breathing rate appears normal, no obvious gross SOB, gasping or wheezing  CV: no obvious cyanosis  MS: moves all visible extremities without noticeable abnormality  PSYCH/NEURO: pleasant and cooperative, no obvious depression or anxiety, speech and thought processing grossly intact  ASSESSMENT AND PLAN:  Discussed the following assessment and plan:  Postural dizziness  Chest wall pain  -we discussed possible serious and likely etiologies, options for evaluation and workup, limitations of telemedicine visit vs in person visit, treatment, treatment risks and precautions. Pt prefers to treat via telemedicine empirically rather then risking or undertaking an in person visit at this moment. Her dizziness seems to be postural or positional and on ROC fairly low BP in the past. The chest pain, from her description, seems unrelated and likely is compression type chest wall pain. However, I did advise a visit inperson with her PCP to check orthostatic BPs, possible EKG and further evaluation if needed. She is in agreement. In the interim will try staying hydrated, especially when out in the heat, changing positions slowly and avoiding the offending bra. Sent message to schedulers to assist. Patient agrees to seek prompt in person care if worsening, new symptoms arise, or if is not improving with above measures in the interim.   I discussed the assessment and treatment plan with the patient. The patient was provided an opportunity to ask questions and all were answered. The patient agreed with the plan and demonstrated an understanding of the instructions.   The patient was advised to call back or seek an  in-person evaluation if the symptoms worsen or if the condition fails to improve as anticipated.   Terressa Koyanagi, DO

## 2019-07-01 ENCOUNTER — Telehealth: Payer: Self-pay | Admitting: Family Medicine

## 2019-07-01 NOTE — Telephone Encounter (Signed)
Pt would like to transfer to Dr. Ardyth Harps from Dr. Salomon Fick, if possible. Is it okay, for a transfer of care? Please advise. Thanks

## 2019-07-04 NOTE — Telephone Encounter (Signed)
Ok

## 2019-07-05 NOTE — Telephone Encounter (Signed)
Ok with me 

## 2019-07-05 NOTE — Telephone Encounter (Signed)
Pt is aware that Dr. Ardyth Harps, will accept pt and she will call back to schedule an appt.

## 2019-07-27 ENCOUNTER — Encounter: Payer: Self-pay | Admitting: Internal Medicine

## 2019-07-27 ENCOUNTER — Other Ambulatory Visit: Payer: Self-pay

## 2019-07-27 ENCOUNTER — Ambulatory Visit (INDEPENDENT_AMBULATORY_CARE_PROVIDER_SITE_OTHER): Payer: 59 | Admitting: Internal Medicine

## 2019-07-27 VITALS — BP 110/70 | HR 91 | Temp 97.0°F | Wt 183.3 lb

## 2019-07-27 DIAGNOSIS — R0789 Other chest pain: Secondary | ICD-10-CM | POA: Diagnosis not present

## 2019-07-27 DIAGNOSIS — F329 Major depressive disorder, single episode, unspecified: Secondary | ICD-10-CM | POA: Diagnosis not present

## 2019-07-27 DIAGNOSIS — R42 Dizziness and giddiness: Secondary | ICD-10-CM

## 2019-07-27 DIAGNOSIS — F32A Depression, unspecified: Secondary | ICD-10-CM

## 2019-07-27 DIAGNOSIS — F419 Anxiety disorder, unspecified: Secondary | ICD-10-CM | POA: Diagnosis not present

## 2019-07-27 NOTE — Progress Notes (Signed)
Established Patient Office Visit     This visit occurred during the SARS-CoV-2 public health emergency.  Safety protocols were in place, including screening questions prior to the visit, additional usage of staff PPE, and extensive cleaning of exam room while observing appropriate contact time as indicated for disinfecting solutions.    CC/Reason for Visit: Establish care, discuss some acute and chronic conditions  HPI: Rebekah Baker is a 44 y.o. female who is coming in today for the above mentioned reasons. Past Medical History is significant for: Anxiety and depression that has been well controlled on Wellbutrin 200 mg daily.  She has been having some acute issues.  1.  She has pain in the center of her chest usually with movement, worse when wearing tight undergarments with underwire.  It hurts more when she presses on her chest.  She does not have symptoms with exertion, no associated symptoms such as shortness of breath.  2.  She has been doing a lot of gardening this spring and summer.  When she stands up abruptly after bending down she feels lightheaded and dizzy, she feels like she needs to sit down in order to avoid passing out.  She does not have true vertigo symptoms.  Past Medical/Surgical History: Past Medical History:  Diagnosis Date  . Depression     Past Surgical History:  Procedure Laterality Date  . APPENDECTOMY    . BREAST SURGERY      Social History:  reports that she has never smoked. She has never used smokeless tobacco. She reports current alcohol use. She reports that she does not use drugs.  Allergies: No Known Allergies  Family History:  No history of heart disease, cancer, stroke that she is apparent.  Current Outpatient Medications:  .  buPROPion (WELLBUTRIN SR) 200 MG 12 hr tablet, Take 200 mg by mouth daily., Disp: , Rfl:   Review of Systems:  Constitutional: Denies fever, chills, diaphoresis, appetite change and fatigue.  HEENT:  Denies photophobia, eye pain, redness, hearing loss, ear pain, congestion, sore throat, rhinorrhea, sneezing, mouth sores, trouble swallowing, neck pain, neck stiffness and tinnitus.   Respiratory: Denies SOB, DOE, cough, chest tightness,  and wheezing.   Cardiovascular: Denies  palpitations and leg swelling.  Gastrointestinal: Denies nausea, vomiting, abdominal pain, diarrhea, constipation, blood in stool and abdominal distention.  Genitourinary: Denies dysuria, urgency, frequency, hematuria, flank pain and difficulty urinating.  Endocrine: Denies: hot or cold intolerance, sweats, changes in hair or nails, polyuria, polydipsia. Musculoskeletal: Denies myalgias, back pain, joint swelling, arthralgias and gait problem.  Skin: Denies pallor, rash and wound.  Neurological: Denies  seizures, syncope, weakness,  numbness and headaches.  Hematological: Denies adenopathy. Easy bruising, personal or family bleeding history  Psychiatric/Behavioral: Denies suicidal ideation, mood changes, confusion, nervousness, sleep disturbance and agitation    Physical Exam: Vitals:   07/27/19 0735  BP: 110/70  Pulse: 91  Temp: (!) 97 F (36.1 C)  TempSrc: Temporal  SpO2: 97%  Weight: 183 lb 4.8 oz (83.1 kg)    There is no height or weight on file to calculate BMI.   Constitutional: NAD, calm, comfortable Eyes: PERRL, lids and conjunctivae normal ENMT: Mucous membranes are moist.  Respiratory: clear to auscultation bilaterally, no wheezing, no crackles. Normal respiratory effort. No accessory muscle use.  Cardiovascular: Regular rate and rhythm, no murmurs / rubs / gallops. No extremity edema.  Neurologic: Grossly intact and nonfocal. Psychiatric: Normal judgment and insight. Alert and oriented x 3. Normal mood.  Impression and Plan:  Anxiety and depression -Well-controlled on Wellbutrin, mood is stable.  Chest wall pain -It sounds like possibly costochondritis versus muscular given pain is  worse with tight undergarments and with palpation. -She has no anginal symptoms. -Have advised as needed ibuprofen, I do not believe further work-up is necessary at this time, but she is advised to contact us immediately with progressive or worsening symptoms.  Dizziness -Suspect mild orthostatic hypotension given clinical presentation. -Have advised increasing fluid intake especially during the summertime when she will be outside.   Patient Instructions  -Nice seeing you today!!  -Schedule follow up in 6 months for your physical. Please come in fasting.  -Follow up on your mammogram from last year.     Chaya Jan, MD [facility]

## 2019-07-27 NOTE — Patient Instructions (Signed)
-  Nice seeing you today!!  -Schedule follow up in 6 months for your physical. Please come in fasting.  -Follow up on your mammogram from last year.

## 2020-01-09 LAB — HM MAMMOGRAPHY: HM Mammogram: NORMAL (ref 0–4)

## 2020-01-19 ENCOUNTER — Encounter: Payer: Self-pay | Admitting: Internal Medicine

## 2020-01-30 DIAGNOSIS — N644 Mastodynia: Secondary | ICD-10-CM | POA: Diagnosis not present

## 2020-01-31 DIAGNOSIS — N644 Mastodynia: Secondary | ICD-10-CM | POA: Diagnosis not present

## 2020-01-31 DIAGNOSIS — N943 Premenstrual tension syndrome: Secondary | ICD-10-CM | POA: Diagnosis not present

## 2020-01-31 DIAGNOSIS — R61 Generalized hyperhidrosis: Secondary | ICD-10-CM | POA: Diagnosis not present

## 2020-02-06 DIAGNOSIS — F419 Anxiety disorder, unspecified: Secondary | ICD-10-CM | POA: Diagnosis not present

## 2020-02-06 DIAGNOSIS — F3281 Premenstrual dysphoric disorder: Secondary | ICD-10-CM | POA: Diagnosis not present

## 2020-02-06 DIAGNOSIS — F331 Major depressive disorder, recurrent, moderate: Secondary | ICD-10-CM | POA: Diagnosis not present

## 2020-02-23 ENCOUNTER — Encounter: Payer: Self-pay | Admitting: Internal Medicine

## 2020-03-22 ENCOUNTER — Other Ambulatory Visit: Payer: Self-pay

## 2020-03-23 ENCOUNTER — Encounter: Payer: Self-pay | Admitting: Internal Medicine

## 2020-03-23 ENCOUNTER — Ambulatory Visit: Payer: BC Managed Care – PPO | Admitting: Internal Medicine

## 2020-03-23 VITALS — BP 110/80 | HR 98 | Temp 98.1°F | Wt 175.9 lb

## 2020-03-23 DIAGNOSIS — R0789 Other chest pain: Secondary | ICD-10-CM | POA: Diagnosis not present

## 2020-03-23 DIAGNOSIS — M5441 Lumbago with sciatica, right side: Secondary | ICD-10-CM | POA: Diagnosis not present

## 2020-03-23 NOTE — Patient Instructions (Signed)
-Nice seeing you today!!  -icing, ibuprofen, back stretches daily. If no better in 2-3 weeks, please contact us.  -Schedule follow up in 3 months for your physical. Please come in fasting that day.   Sciatica  Sciatica is pain, weakness, tingling, or loss of feeling (numbness) along the sciatic nerve. The sciatic nerve starts in the lower back and goes down the back of each leg. Sciatica usually goes away on its own or with treatment. Sometimes, sciatica may come back (recur). What are the causes? This condition happens when the sciatic nerve is pinched or has pressure put on it. This may be the result of:  A disk in between the bones of the spine bulging out too far (herniated disk).  Changes in the spinal disks that occur with aging.  A condition that affects a muscle in the butt.  Extra bone growth near the sciatic nerve.  A break (fracture) of the area between your hip bones (pelvis).  Pregnancy.  Tumor. This is rare. What increases the risk? You are more likely to develop this condition if you:  Play sports that put pressure or stress on the spine.  Have poor strength and ease of movement (flexibility).  Have had a back injury in the past.  Have had back surgery.  Sit for long periods of time.  Do activities that involve bending or lifting over and over again.  Are very overweight (obese). What are the signs or symptoms? Symptoms can vary from mild to very bad. They may include:  Any of these problems in the lower back, leg, hip, or butt: ? Mild tingling, loss of feeling, or dull aches. ? Burning sensations. ? Sharp pains.  Loss of feeling in the back of the calf or the sole of the foot.  Leg weakness.  Very bad back pain that makes it hard to move. These symptoms may get worse when you cough, sneeze, or laugh. They may also get worse when you sit or stand for long periods of time. How is this treated? This condition often gets better without any  treatment. However, treatment may include:  Changing or cutting back on physical activity when you have pain.  Doing exercises and stretching.  Putting ice or heat on the affected area.  Medicines that help: ? To relieve pain and swelling. ? To relax your muscles.  Shots (injections) of medicines that help to relieve pain, irritation, and swelling.  Surgery. Follow these instructions at home: Medicines  Take over-the-counter and prescription medicines only as told by your doctor.  Ask your doctor if the medicine prescribed to you: ? Requires you to avoid driving or using heavy machinery. ? Can cause trouble pooping (constipation). You may need to take these steps to prevent or treat trouble pooping:  Drink enough fluids to keep your pee (urine) pale yellow.  Take over-the-counter or prescription medicines.  Eat foods that are high in fiber. These include beans, whole grains, and fresh fruits and vegetables.  Limit foods that are high in fat and sugar. These include fried or sweet foods. Managing pain  If told, put ice on the affected area. ? Put ice in a plastic bag. ? Place a towel between your skin and the bag. ? Leave the ice on for 20 minutes, 2-3 times a day.  If told, put heat on the affected area. Use the heat source that your doctor tells you to use, such as a moist heat pack or a heating pad. ? Place a  towel between your skin and the heat source. ? Leave the heat on for 20-30 minutes. ? Remove the heat if your skin turns bright red. This is very important if you are unable to feel pain, heat, or cold. You may have a greater risk of getting burned.      Activity  Return to your normal activities as told by your doctor. Ask your doctor what activities are safe for you.  Avoid activities that make your symptoms worse.  Take short rests during the day. ? When you rest for a long time, do some physical activity or stretching between periods of rest. ? Avoid  sitting for a long time without moving. Get up and move around at least one time each hour.  Exercise and stretch regularly, as told by your doctor.  Do not lift anything that is heavier than 10 lb (4.5 kg) while you have symptoms of sciatica. ? Avoid lifting heavy things even when you do not have symptoms. ? Avoid lifting heavy things over and over.  When you lift objects, always lift in a way that is safe for your body. To do this, you should: ? Bend your knees. ? Keep the object close to your body. ? Avoid twisting.   General instructions  Stay at a healthy weight.  Wear comfortable shoes that support your feet. Avoid wearing high heels.  Avoid sleeping on a mattress that is too soft or too hard. You might have less pain if you sleep on a mattress that is firm enough to support your back.  Keep all follow-up visits as told by your doctor. This is important. Contact a doctor if:  You have pain that: ? Wakes you up when you are sleeping. ? Gets worse when you lie down. ? Is worse than the pain you have had in the past. ? Lasts longer than 4 weeks.  You lose weight without trying. Get help right away if:  You cannot control when you pee (urinate) or poop (have a bowel movement).  You have weakness in any of these areas and it gets worse: ? Lower back. ? The area between your hip bones. ? Butt. ? Legs.  You have redness or swelling of your back.  You have a burning feeling when you pee. Summary  Sciatica is pain, weakness, tingling, or loss of feeling (numbness) along the sciatic nerve.  This condition happens when the sciatic nerve is pinched or has pressure put on it.  Sciatica can cause pain, tingling, or loss of feeling (numbness) in the lower back, legs, hips, and butt.  Treatment often includes rest, exercise, medicines, and putting ice or heat on the affected area. This information is not intended to replace advice given to you by your health care provider.  Make sure you discuss any questions you have with your health care provider. Document Revised: 01/25/2018 Document Reviewed: 01/25/2018 Elsevier Patient Education  2021 [employer].

## 2020-03-23 NOTE — Progress Notes (Signed)
Established Patient Office Visit     This visit occurred during the SARS-CoV-2 public health emergency.  Safety protocols were in place, including screening questions prior to the visit, additional usage of staff PPE, and extensive cleaning of exam room while observing appropriate contact time as indicated for disinfecting solutions.    CC/Reason for Visit: Right hip and buttock pain  HPI: Rebekah Baker is a 45 y.o. female who is coming in today for the above mentioned reasons.  For the past 3 to 4 days she has been having right buttock and lower back pain.  At times will radiate down the back of her leg till around the knee area.  Sitting down has become uncomfortable.  She has had no weakness of her legs.  She continues to have some chest wall pain.  No personal or family risk factors for heart disease.  Past Medical/Surgical History: Past Medical History:  Diagnosis Date  . Depression     Past Surgical History:  Procedure Laterality Date  . APPENDECTOMY    . BREAST SURGERY      Social History:  reports that she has never smoked. She has never used smokeless tobacco. She reports current alcohol use. She reports that she does not use drugs.  Allergies: No Known Allergies  Family History:  No history of heart disease, cancer, stroke that she is aware of  Current Outpatient Medications:  .  buPROPion (WELLBUTRIN SR) 200 MG 12 hr tablet, Take 200 mg by mouth daily., Disp: , Rfl:   Review of Systems:  Constitutional: Denies fever, chills, diaphoresis, appetite change and fatigue.  HEENT: Denies photophobia, eye pain, redness, hearing loss, ear pain, congestion, sore throat, rhinorrhea, sneezing, mouth sores, trouble swallowing, neck pain, neck stiffness and tinnitus.   Respiratory: Denies SOB, DOE, cough, chest tightness,  and wheezing.   Cardiovascular: Denies chest pain, palpitations and leg swelling.  Gastrointestinal: Denies nausea, vomiting, abdominal pain,  diarrhea, constipation, blood in stool and abdominal distention.  Genitourinary: Denies dysuria, urgency, frequency, hematuria, flank pain and difficulty urinating.  Endocrine: Denies: hot or cold intolerance, sweats, changes in hair or nails, polyuria, polydipsia. Musculoskeletal: Denies  joint swelling, arthralgias. Skin: Denies pallor, rash and wound.  Neurological: Denies dizziness, seizures, syncope, weakness, light-headedness, numbness and headaches.  Hematological: Denies adenopathy. Easy bruising, personal or family bleeding history  Psychiatric/Behavioral: Denies suicidal ideation, mood changes, confusion, nervousness, sleep disturbance and agitation    Physical Exam: Vitals:   03/23/20 1440  BP: 110/80  Pulse: 98  Temp: 98.1 F (36.7 C)  TempSrc: Oral  SpO2: 99%  Weight: 175 lb 14.4 oz (79.8 kg)    There is no height or weight on file to calculate BMI.   Constitutional: NAD, calm, comfortable Eyes: PERRL, lids and conjunctivae normal ENMT: Mucous membranes are moist.  Neurologic: Grossly intact and nonfocal Psychiatric: Normal judgment and insight. Alert and oriented x 3. Normal mood.    Impression and Plan:  Acute left-sided low back pain with right-sided sciatica -Suspect sciatica. -Advised icing, ibuprofen, back exercises/stretches that she has been provided with today.  If persists can consider steroid taper and referral to physical therapy.  See no need to order MRI today as first-line therapy.  Chest wall pain -Based on presentation suspect costochondritis.  Ibuprofen that she is taking for sciatica should help. -She has no personal or family risk factors for heart disease. -She will schedule follow-up for physical at which time we will do labs and an EKG for  further risk stratification.    Patient Instructions   -Nice seeing you today!!  -icing, ibuprofen, back stretches daily. If no better in 2-3 weeks, please contact us.  -Schedule follow up in 3  months for your physical. Please come in fasting that day.   Sciatica  Sciatica is pain, weakness, tingling, or loss of feeling (numbness) along the sciatic nerve. The sciatic nerve starts in the lower back and goes down the back of each leg. Sciatica usually goes away on its own or with treatment. Sometimes, sciatica may come back (recur). What are the causes? This condition happens when the sciatic nerve is pinched or has pressure put on it. This may be the result of:  A disk in between the bones of the spine bulging out too far (herniated disk).  Changes in the spinal disks that occur with aging.  A condition that affects a muscle in the butt.  Extra bone growth near the sciatic nerve.  A break (fracture) of the area between your hip bones (pelvis).  Pregnancy.  Tumor. This is rare. What increases the risk? You are more likely to develop this condition if you:  Play sports that put pressure or stress on the spine.  Have poor strength and ease of movement (flexibility).  Have had a back injury in the past.  Have had back surgery.  Sit for long periods of time.  Do activities that involve bending or lifting over and over again.  Are very overweight (obese). What are the signs or symptoms? Symptoms can vary from mild to very bad. They may include:  Any of these problems in the lower back, leg, hip, or butt: ? Mild tingling, loss of feeling, or dull aches. ? Burning sensations. ? Sharp pains.  Loss of feeling in the back of the calf or the sole of the foot.  Leg weakness.  Very bad back pain that makes it hard to move. These symptoms may get worse when you cough, sneeze, or laugh. They may also get worse when you sit or stand for long periods of time. How is this treated? This condition often gets better without any treatment. However, treatment may include:  Changing or cutting back on physical activity when you have pain.  Doing exercises and  stretching.  Putting ice or heat on the affected area.  Medicines that help: ? To relieve pain and swelling. ? To relax your muscles.  Shots (injections) of medicines that help to relieve pain, irritation, and swelling.  Surgery. Follow these instructions at home: Medicines  Take over-the-counter and prescription medicines only as told by your doctor.  Ask your doctor if the medicine prescribed to you: ? Requires you to avoid driving or using heavy machinery. ? Can cause trouble pooping (constipation). You may need to take these steps to prevent or treat trouble pooping:  Drink enough fluids to keep your pee (urine) pale yellow.  Take over-the-counter or prescription medicines.  Eat foods that are high in fiber. These include beans, whole grains, and fresh fruits and vegetables.  Limit foods that are high in fat and sugar. These include fried or sweet foods. Managing pain  If told, put ice on the affected area. ? Put ice in a plastic bag. ? Place a towel between your skin and the bag. ? Leave the ice on for 20 minutes, 2-3 times a day.  If told, put heat on the affected area. Use the heat source that your doctor tells you to use, such as  a moist heat pack or a heating pad. ? Place a towel between your skin and the heat source. ? Leave the heat on for 20-30 minutes. ? Remove the heat if your skin turns bright red. This is very important if you are unable to feel pain, heat, or cold. You may have a greater risk of getting burned.      Activity  Return to your normal activities as told by your doctor. Ask your doctor what activities are safe for you.  Avoid activities that make your symptoms worse.  Take short rests during the day. ? When you rest for a long time, do some physical activity or stretching between periods of rest. ? Avoid sitting for a long time without moving. Get up and move around at least one time each hour.  Exercise and stretch regularly, as told by  your doctor.  Do not lift anything that is heavier than 10 lb (4.5 kg) while you have symptoms of sciatica. ? Avoid lifting heavy things even when you do not have symptoms. ? Avoid lifting heavy things over and over.  When you lift objects, always lift in a way that is safe for your body. To do this, you should: ? Bend your knees. ? Keep the object close to your body. ? Avoid twisting.   General instructions  Stay at a healthy weight.  Wear comfortable shoes that support your feet. Avoid wearing high heels.  Avoid sleeping on a mattress that is too soft or too hard. You might have less pain if you sleep on a mattress that is firm enough to support your back.  Keep all follow-up visits as told by your doctor. This is important. Contact a doctor if:  You have pain that: ? Wakes you up when you are sleeping. ? Gets worse when you lie down. ? Is worse than the pain you have had in the past. ? Lasts longer than 4 weeks.  You lose weight without trying. Get help right away if:  You cannot control when you pee (urinate) or poop (have a bowel movement).  You have weakness in any of these areas and it gets worse: ? Lower back. ? The area between your hip bones. ? Butt. ? Legs.  You have redness or swelling of your back.  You have a burning feeling when you pee. Summary  Sciatica is pain, weakness, tingling, or loss of feeling (numbness) along the sciatic nerve.  This condition happens when the sciatic nerve is pinched or has pressure put on it.  Sciatica can cause pain, tingling, or loss of feeling (numbness) in the lower back, legs, hips, and butt.  Treatment often includes rest, exercise, medicines, and putting ice or heat on the affected area. This information is not intended to replace advice given to you by your health care provider. Make sure you discuss any questions you have with your health care provider. Document Revised: 01/25/2018 Document Reviewed:  01/25/2018 Elsevier Patient Education  2021 [employer]vier Inc.      Chaya Jan, MD [facility] at [facility]

## 2020-05-02 DIAGNOSIS — F419 Anxiety disorder, unspecified: Secondary | ICD-10-CM | POA: Diagnosis not present

## 2020-05-02 DIAGNOSIS — F331 Major depressive disorder, recurrent, moderate: Secondary | ICD-10-CM | POA: Diagnosis not present

## 2020-05-02 DIAGNOSIS — F3281 Premenstrual dysphoric disorder: Secondary | ICD-10-CM | POA: Diagnosis not present

## 2020-07-26 DIAGNOSIS — F331 Major depressive disorder, recurrent, moderate: Secondary | ICD-10-CM | POA: Diagnosis not present

## 2020-07-26 DIAGNOSIS — F419 Anxiety disorder, unspecified: Secondary | ICD-10-CM | POA: Diagnosis not present

## 2020-07-26 DIAGNOSIS — F3281 Premenstrual dysphoric disorder: Secondary | ICD-10-CM | POA: Diagnosis not present

## 2020-09-05 ENCOUNTER — Other Ambulatory Visit: Payer: Self-pay

## 2020-09-06 ENCOUNTER — Ambulatory Visit: Payer: BC Managed Care – PPO | Admitting: Internal Medicine

## 2020-09-06 ENCOUNTER — Encounter: Payer: Self-pay | Admitting: Internal Medicine

## 2020-09-06 VITALS — BP 130/90 | HR 100 | Temp 98.2°F | Wt 173.6 lb

## 2020-09-06 DIAGNOSIS — R519 Headache, unspecified: Secondary | ICD-10-CM | POA: Diagnosis not present

## 2020-09-06 DIAGNOSIS — R030 Elevated blood-pressure reading, without diagnosis of hypertension: Secondary | ICD-10-CM | POA: Diagnosis not present

## 2020-09-06 NOTE — Progress Notes (Signed)
Established Patient Office Visit     This visit occurred during the SARS-CoV-2 public health emergency.  Safety protocols were in place, including screening questions prior to the visit, additional usage of staff PPE, and extensive cleaning of exam room while observing appropriate contact time as indicated for disinfecting solutions.    CC/Reason for Visit: Headache and elevated blood pressure  HPI: Rebekah Baker is a 45 y.o. female who is coming in today for the above mentioned reasons. Past Medical History is significant for: Depression with stable mood.  For the past 3 weeks she has been having a frontal/bitemporal headache.  Lately she has noticed some bilateral ear pressure as well.  Mild postnasal drip.  No recent URI.  She has been taking Excedrin and Allegra-D without relief.  She is overdue to have an eye exam and has already scheduled it.  She has started taking her blood pressure at home and has noticed routinely systolics in the 130s and diastolics in the upper 80s and 90s.  In office blood pressure today is 130/90.   Past Medical/Surgical History: Past Medical History:  Diagnosis Date   Depression     Past Surgical History:  Procedure Laterality Date   APPENDECTOMY     BREAST SURGERY      Social History:  reports that she has never smoked. She has never used smokeless tobacco. She reports current alcohol use. She reports that she does not use drugs.  Allergies: No Known Allergies  Family History:  Mother and maternal grandmother with hypertension   Current Outpatient Medications:    buPROPion (WELLBUTRIN SR) 200 MG 12 hr tablet, Take 200 mg by mouth daily., Disp: , Rfl:   Review of Systems:  Constitutional: Denies fever, chills, diaphoresis, appetite change and fatigue.  HEENT: Denies photophobia, eye pain, redness, hearing loss,  mouth sores, trouble swallowing, neck pain, neck stiffness and tinnitus.   Respiratory: Denies SOB, DOE, cough, chest  tightness,  and wheezing.   Cardiovascular: Denies chest pain, palpitations and leg swelling.  Gastrointestinal: Denies nausea, vomiting, abdominal pain, diarrhea, constipation, blood in stool and abdominal distention.  Genitourinary: Denies dysuria, urgency, frequency, hematuria, flank pain and difficulty urinating.  Endocrine: Denies: hot or cold intolerance, sweats, changes in hair or nails, polyuria, polydipsia. Musculoskeletal: Denies myalgias, back pain, joint swelling, arthralgias and gait problem.  Skin: Denies pallor, rash and wound.  Neurological: Denies dizziness, seizures, syncope, weakness, light-headedness, numbness. Hematological: Denies adenopathy. Easy bruising, personal or family bleeding history  Psychiatric/Behavioral: Denies suicidal ideation, mood changes, confusion, nervousness, sleep disturbance and agitation    Physical Exam: Vitals:   09/06/20 1446  BP: 130/90  Pulse: 100  Temp: 98.2 F (36.8 C)  TempSrc: Oral  SpO2: 98%  Weight: 173 lb 9.6 oz (78.7 kg)      Constitutional: NAD, calm, comfortable Eyes: PERRL, lids and conjunctivae normal ENMT: Mucous membranes are moist.  Tympanic membrane is pearly white, no erythema or bulging.  She has pain to palpation of her maxillary, frontal and ethmoidal sinuses. Respiratory: clear to auscultation bilaterally, no wheezing, no crackles. Normal respiratory effort. No accessory muscle use.  Cardiovascular: Regular rate and rhythm, no murmurs / rubs / gallops. No extremity edema.  Neurologic: Grossly intact and nonfocal Psychiatric: Normal judgment and insight. Alert and oriented x 3. Normal mood.    Impression and Plan:  Sinus headache Elevated BP without diagnosis of hypertension  -By description of her headache I suspect it is a sinus headache. -Have advised that  she discontinue use of Allegra D as the decongestant might be raising blood pressure. -She will do antihistamines, OTC pain relievers and  guaifenesin for 2 weeks. -She is noted to have elevated blood pressure in office today, unclear if this is as result of the headache or the cause of her headache. -She will do ambulatory blood pressure monitoring and return in 6 weeks for follow-up.     Chaya Jan, MD [facility]

## 2020-09-07 DIAGNOSIS — F331 Major depressive disorder, recurrent, moderate: Secondary | ICD-10-CM | POA: Diagnosis not present

## 2020-09-07 DIAGNOSIS — F3281 Premenstrual dysphoric disorder: Secondary | ICD-10-CM | POA: Diagnosis not present

## 2020-09-07 DIAGNOSIS — F419 Anxiety disorder, unspecified: Secondary | ICD-10-CM | POA: Diagnosis not present

## 2020-09-13 ENCOUNTER — Telehealth (INDEPENDENT_AMBULATORY_CARE_PROVIDER_SITE_OTHER): Payer: BC Managed Care – PPO | Admitting: Internal Medicine

## 2020-09-13 ENCOUNTER — Encounter: Payer: Self-pay | Admitting: Internal Medicine

## 2020-09-13 VITALS — BP 117/77 | Wt 167.0 lb

## 2020-09-13 DIAGNOSIS — R519 Headache, unspecified: Secondary | ICD-10-CM | POA: Diagnosis not present

## 2020-09-13 DIAGNOSIS — R030 Elevated blood-pressure reading, without diagnosis of hypertension: Secondary | ICD-10-CM | POA: Diagnosis not present

## 2020-09-13 MED ORDER — IBUPROFEN 800 MG PO TABS: 800.0000 mg | ORAL_TABLET | Freq: Three times a day (TID) | ORAL | 0 refills | Status: DC | PRN

## 2020-09-13 NOTE — Progress Notes (Signed)
Virtual Visit via Video Note  I connected with Rebekah Baker on 09/13/20 at  9:30 AM EDT by a video enabled telemedicine application and verified that I am speaking with the correct person using two identifiers.  Location patient: home Location provider: work office Persons participating in the virtual visit: patient, provider  I discussed the limitations of evaluation and management by telemedicine and the availability of in person appointments. The patient expressed understanding and agreed to proceed.   HPI: I saw her on 8/18.  At that time we diagnosed her with elevated blood pressure, not yet diagnosed as hypertension, as well as a sinus headache.  She was asked to take daily antihistamines and guaifenesin and track her blood pressure.  She scheduled this visit today because she continues to have a headache.  Today it is particularly bad.  It is usually bitemporal but today it is focused over her left temporal area.  She continues to have ear pressure, no nausea or vomiting, no vision disturbances.  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  Today is the first day since she saw me that her blood pressure has been normal, today it is 117/77 but typically it is in the 130/90 range which coincides with her in office measurement.   ROS: Constitutional: Denies fever, chills, diaphoresis, appetite change and fatigue.  HEENT: Denies photophobia, eye pain, redness, hearing loss, mouth sores, trouble swallowing, neck pain, neck stiffness and tinnitus.   Respiratory: Denies SOB, DOE, cough, chest tightness,  and wheezing.   Cardiovascular: Denies chest pain, palpitations and leg swelling.  Gastrointestinal: Denies nausea, vomiting, abdominal pain, diarrhea, constipation, blood in stool and abdominal distention.  Genitourinary: Denies dysuria, urgency, frequency, hematuria, flank pain and difficulty urinating.  Endocrine: Denies: hot or cold intolerance, sweats,  changes in hair or nails, polyuria, polydipsia. Musculoskeletal: Denies myalgias, back pain, joint swelling, arthralgias and gait problem.  Skin: Denies pallor, rash and wound.  Neurological: Denies dizziness, seizures, syncope, weakness, light-headedness, numbness. Hematological: Denies adenopathy. Easy bruising, personal or family bleeding history  Psychiatric/Behavioral: Denies suicidal ideation, mood changes, confusion, nervousness, sleep disturbance and agitation   Past Medical History:  Diagnosis Date   Depression     Past Surgical History:  Procedure Laterality Date   APPENDECTOMY     BREAST SURGERY      No family history on file.  SOCIAL HX:   reports that she has never smoked. She has never used smokeless tobacco. She reports current alcohol use. She reports that she does not use drugs.   Current Outpatient Medications:    buPROPion (WELLBUTRIN SR) 200 MG 12 hr tablet, Take 200 mg by mouth daily., Disp: , Rfl:    ibuprofen (ADVIL) 800 MG tablet, Take 1 tablet (800 mg total) by mouth every 8 (eight) hours as needed., Disp: 30 tablet, Rfl: 0  EXAM:   VITALS per patient if applicable: Blood pressure of 117/77  GENERAL: alert, oriented, appears well and in pain due to her headache  HEENT: atraumatic, conjunttiva clear, no obvious abnormalities on inspection of external nose and ears  NECK: normal movements of the head and neck  LUNGS: on inspection no signs of respiratory distress, breathing rate appears normal, no obvious gross increased work of breathing, gasping or wheezing  CV: no obvious cyanosis  MS: moves all visible extremities without noticeable abnormality  PSYCH/NEURO: pleasant and cooperative, no obvious depression or anxiety, speech and thought processing grossly intact  ASSESSMENT  AND PLAN:   Sinus headache  Elevated BP without diagnosis of hypertension -I do still think there is a component of sinus headache given her ear pressure and increased  bilateral tympanic membrane light reflex during her office visit, so have asked her to continue use of guaifenesin and antihistamines. -I am starting to believe that her diastolic hypertension may be playing a role in her headaches. -She already has a 6-week appointment to follow-up on her ambulatory blood pressure measurements. -I will prescribe ibuprofen 800 mg for her to take as needed for pain.     I discussed the assessment and treatment plan with the patient. The patient was provided an opportunity to ask questions and all were answered. The patient agreed with the plan and demonstrated an understanding of the instructions.   The patient was advised to call back or seek an in-person evaluation if the symptoms worsen or if the condition fails to improve as anticipated.    Chaya Jan, MD  [facility] at [facility]

## 2020-09-14 ENCOUNTER — Ambulatory Visit: Payer: BC Managed Care – PPO | Admitting: Internal Medicine

## 2020-10-03 DIAGNOSIS — H10413 Chronic giant papillary conjunctivitis, bilateral: Secondary | ICD-10-CM | POA: Diagnosis not present

## 2020-10-03 DIAGNOSIS — H5213 Myopia, bilateral: Secondary | ICD-10-CM | POA: Diagnosis not present

## 2020-10-03 DIAGNOSIS — H524 Presbyopia: Secondary | ICD-10-CM | POA: Diagnosis not present

## 2020-10-08 DIAGNOSIS — M79671 Pain in right foot: Secondary | ICD-10-CM | POA: Diagnosis not present

## 2020-10-08 DIAGNOSIS — S93431A Sprain of tibiofibular ligament of right ankle, initial encounter: Secondary | ICD-10-CM | POA: Diagnosis not present

## 2020-10-08 DIAGNOSIS — M25571 Pain in right ankle and joints of right foot: Secondary | ICD-10-CM | POA: Diagnosis not present

## 2020-10-08 DIAGNOSIS — M65871 Other synovitis and tenosynovitis, right ankle and foot: Secondary | ICD-10-CM | POA: Diagnosis not present

## 2020-10-19 ENCOUNTER — Ambulatory Visit: Payer: BC Managed Care – PPO | Admitting: Internal Medicine

## 2021-01-02 ENCOUNTER — Encounter: Payer: Self-pay | Admitting: Internal Medicine

## 2021-01-02 ENCOUNTER — Telehealth: Payer: Self-pay | Admitting: Internal Medicine

## 2021-01-02 ENCOUNTER — Ambulatory Visit: Payer: BC Managed Care – PPO | Admitting: Internal Medicine

## 2021-01-02 VITALS — BP 120/88 | HR 90 | Temp 98.4°F | Wt 185.6 lb

## 2021-01-02 DIAGNOSIS — J209 Acute bronchitis, unspecified: Secondary | ICD-10-CM

## 2021-01-02 DIAGNOSIS — R6889 Other general symptoms and signs: Secondary | ICD-10-CM

## 2021-01-02 LAB — POCT INFLUENZA A/B
Influenza A, POC: NEGATIVE
Influenza B, POC: NEGATIVE

## 2021-01-02 MED ORDER — AZITHROMYCIN 250 MG PO TABS: ORAL_TABLET | ORAL | 0 refills | Status: AC

## 2021-01-02 NOTE — Progress Notes (Signed)
Acute office Visit     This visit occurred during the SARS-CoV-2 public health emergency.  Safety protocols were in place, including screening questions prior to the visit, additional usage of staff PPE, and extensive cleaning of exam room while observing appropriate contact time as indicated for disinfecting solutions.    CC/Reason for Visit: URI symptoms  HPI: Rebekah Baker is a 45 y.o. female who is coming in today for the above mentioned reasons.  Children were sick about 3 weeks ago.  She developed classic URI symptoms about 2-1/2 weeks ago.  Over the past week it has progressed to bilateral ear pressure and fullness, significant postnasal drip and coughing with green sputum production.  She has no fever, she does have a sore throat, no shortness of breath.  She has been using Chloraseptic and Mucinex without relief.  Past Medical/Surgical History: Past Medical History:  Diagnosis Date   Depression     Past Surgical History:  Procedure Laterality Date   APPENDECTOMY     BREAST SURGERY      Social History:  reports that she has never smoked. She has never used smokeless tobacco. She reports current alcohol use. She reports that she does not use drugs.  Allergies: No Known Allergies  Family History:  No heart disease, cancer, stroke that she is aware of   Current Outpatient Medications:    azithromycin (ZITHROMAX) 250 MG tablet, Take 2 tablets on day 1, then 1 tablet daily on days 2 through 5, Disp: 6 tablet, Rfl: 0   buPROPion (WELLBUTRIN SR) 200 MG 12 hr tablet, Take 200 mg by mouth daily., Disp: , Rfl:    ibuprofen (ADVIL) 800 MG tablet, Take 1 tablet (800 mg total) by mouth every 8 (eight) hours as needed., Disp: 30 tablet, Rfl: 0  Review of Systems:  Constitutional: Denies fever, chills, diaphoresis, appetite change. HEENT: Denies photophobia, eye pain, redness,  mouth sores, trouble swallowing, neck pain, neck stiffness and tinnitus.   Respiratory:  Denies SOB, DOE, chest tightness,  and wheezing.   Cardiovascular: Denies chest pain, palpitations and leg swelling.  Gastrointestinal: Denies nausea, vomiting, abdominal pain, diarrhea, constipation, blood in stool and abdominal distention.  Genitourinary: Denies dysuria, urgency, frequency, hematuria, flank pain and difficulty urinating.  Endocrine: Denies: hot or cold intolerance, sweats, changes in hair or nails, polyuria, polydipsia. Musculoskeletal: Denies myalgias, back pain, joint swelling, arthralgias and gait problem.  Skin: Denies pallor, rash and wound.  Neurological: Denies dizziness, seizures, syncope, weakness, light-headedness, numbness and headaches.  Hematological: Denies adenopathy. Easy bruising, personal or family bleeding history  Psychiatric/Behavioral: Denies suicidal ideation, mood changes, confusion, nervousness, sleep disturbance and agitation    Physical Exam: Vitals:   01/02/21 0932  BP: 120/88  Pulse: 90  Temp: 98.4 F (36.9 C)  TempSrc: Oral  SpO2: 99%  Weight: 185 lb 9.6 oz (84.2 kg)    There is no height or weight on file to calculate BMI.   Constitutional: NAD, calm, comfortable Eyes: PERRL, lids and conjunctivae normal ENMT: Mucous membranes are moist. Posterior pharynx is erythematous but clear of any exudate or lesions. Normal dentition. Tympanic membrane is pearly white, no erythema or bulging. Neck: normal, supple, no masses, no thyromegaly Respiratory: clear to auscultation bilaterally, no wheezing, no crackles. Normal respiratory effort. No accessory muscle use.  Cardiovascular: Regular rate and rhythm, no murmurs / rubs / gallops. No extremity edema. 2+ pedal pulses. No carotid bruits.  Psychiatric: Normal judgment and insight. Alert and oriented x 3.  Normal mood.    Impression and Plan:  Acute bronchitis, unspecified organism - Plan: azithromycin (ZITHROMAX) 250 MG tablet  Flu-like symptoms - Plan: POC Influenza A/B  -Negative  point-of-care flu today, she had a negative COVID test yesterday. -No sign of ear infection, pneumonia, strep throat on exam. -Given duration of symptoms, purulent phlegm, I believe it is appropriate to prescribe a Z-Pak. -She knows to follow-up with no improvement.    Time spent: 22 minutes reviewing chart, interviewing and examining patient and formulating plan of care.   Patient Instructions  -Nice seeing you today!!  -Take zpak as directed for 6 days.    Chaya Jan, MD [facility] at [facility]

## 2021-01-02 NOTE — Telephone Encounter (Signed)
Patient calling in with respiratory symptoms: Shortness of breath, chest pain, palpitations or other red words send to Triage  Does the patient have a fever over 100, cough, congestion, sore throat, runny nose, lost of taste/smell (please list symptoms that patient has)?nasal congestion x 10 days and cough, ear pain x5 days symptoms start? 12-23-2020 (If over 5 days ago, pt may be scheduled for in person visit)  Have you tested for Covid in the last 5 days? No   If yes, was it positive []  OR negative [] ? If positive in the last 5 days, please schedule virtual visit now. If negative, schedule for an in person OV with the next available provider if PCP has no openings. Please also let patient know they will be tested again (follow the script below)  "you will have to arrive prior to your appt time to be Covid tested. Please park in back of office at the cone & call [phone] to let the staff know you have arrived. A staff member will meet you at your car to do a rapid covid test. Once the test has resulted you will be notified by phone of your results to determine if appt will remain an in person visit or be converted to a virtual/phone visit. If you arrive less than before your appt time, your visit will be automatically converted to virtual & any recommended testing will happen AFTER the visit."  Pt has an appt with dr [phone] 01-02-2021 at 930 am THINGS TO REMEMBER  If no availability for virtual visit in office,  please schedule another [place] office  If no availability at another [place] office, please instruct patient that they can schedule an evisit or virtual visit through their mychart account. Visits up to 8pm  patients can be seen in office 5 days after positive COVID test

## 2021-01-02 NOTE — Patient Instructions (Signed)
-  Nice seeing you today!!  -Take zpak as directed for 6 days.

## 2021-01-07 ENCOUNTER — Other Ambulatory Visit: Payer: Self-pay | Admitting: Internal Medicine

## 2021-01-07 ENCOUNTER — Telehealth: Payer: Self-pay | Admitting: Internal Medicine

## 2021-01-07 DIAGNOSIS — J209 Acute bronchitis, unspecified: Secondary | ICD-10-CM

## 2021-01-07 NOTE — Telephone Encounter (Signed)
Pt is calling and seen dr Ardyth Harps on 01-02-2021  for bronchitis and has finished abx and still having symptoms and would like to know if md would call another round of zpak to  [id number] - [place], [place] - [address] Phone:  [phone]  Fax:  [phone]

## 2021-01-08 NOTE — Telephone Encounter (Signed)
Medication denied and message sent to the pharmacy.

## 2021-01-09 ENCOUNTER — Ambulatory Visit: Payer: BC Managed Care – PPO | Admitting: Internal Medicine

## 2021-01-09 ENCOUNTER — Ambulatory Visit (INDEPENDENT_AMBULATORY_CARE_PROVIDER_SITE_OTHER): Payer: BC Managed Care – PPO

## 2021-01-09 ENCOUNTER — Encounter: Payer: Self-pay | Admitting: Internal Medicine

## 2021-01-09 ENCOUNTER — Other Ambulatory Visit: Payer: Self-pay

## 2021-01-09 VITALS — BP 120/82 | HR 80 | Temp 97.8°F | Wt 184.0 lb

## 2021-01-09 DIAGNOSIS — R052 Subacute cough: Secondary | ICD-10-CM

## 2021-01-09 DIAGNOSIS — R059 Cough, unspecified: Secondary | ICD-10-CM | POA: Diagnosis not present

## 2021-01-09 MED ORDER — ALBUTEROL SULFATE HFA 108 (90 BASE) MCG/ACT IN AERS: 2.0000 | INHALATION_SPRAY | Freq: Four times a day (QID) | RESPIRATORY_TRACT | 0 refills | Status: DC | PRN

## 2021-01-09 MED ORDER — BENZONATATE 100 MG PO CAPS: 100.0000 mg | ORAL_CAPSULE | Freq: Two times a day (BID) | ORAL | 0 refills | Status: DC | PRN

## 2021-01-09 NOTE — Progress Notes (Signed)
Established Patient Office Visit     This visit occurred during the SARS-CoV-2 public health emergency.  Safety protocols were in place, including screening questions prior to the visit, additional usage of staff PPE, and extensive cleaning of exam room while observing appropriate contact time as indicated for disinfecting solutions.    CC/Reason for Visit: Continued cough  HPI: Bobette Niccoli is a 45 y.o. female who is coming in today for the above mentioned reasons.  She was seen earlier this month with a 2-1/2-week duration of cough.  She was diagnosed with bronchitis and treated with azithromycin.  She has failed to improve.  She has a dry hacking cough, couple times has been productive but mostly dry.  She goes through coughing spells.  She has to sleep upright, she feels more fatigued and does get a little dyspneic with extreme exertion.  No fevers.  Past Medical/Surgical History: Past Medical History:  Diagnosis Date   Depression     Past Surgical History:  Procedure Laterality Date   APPENDECTOMY     BREAST SURGERY      Social History:  reports that she has never smoked. She has never used smokeless tobacco. She reports current alcohol use. She reports that she does not use drugs.  Allergies: No Known Allergies  Family History:  No history of heart disease, cancer, stroke   Current Outpatient Medications:    albuterol (VENTOLIN HFA) 108 (90 Base) MCG/ACT inhaler, Inhale 2 puffs into the lungs every 6 (six) hours as needed for wheezing or shortness of breath., Disp: 8 g, Rfl: 0   benzonatate (TESSALON) 100 MG capsule, Take 1 capsule (100 mg total) by mouth 2 (two) times daily as needed for cough., Disp: 20 capsule, Rfl: 0   buPROPion (WELLBUTRIN SR) 200 MG 12 hr tablet, Take 200 mg by mouth daily., Disp: , Rfl:    ibuprofen (ADVIL) 800 MG tablet, Take 1 tablet (800 mg total) by mouth every 8 (eight) hours as needed. (Patient not taking: Reported on 01/09/2021),  Disp: 30 tablet, Rfl: 0  Review of Systems:  Constitutional: Denies fever, chills, diaphoresis.  Feels extremely fatigued HEENT: Denies photophobia, eye pain, redness, , mouth sores, trouble swallowing, neck pain, neck stiffness and tinnitus.   Respiratory: Denies , chest tightness,  and wheezing.   Cardiovascular: Denies chest pain, palpitations and leg swelling.  Gastrointestinal: Denies nausea, vomiting, abdominal pain, diarrhea, constipation, blood in stool and abdominal distention.  Genitourinary: Denies dysuria, urgency, frequency, hematuria, flank pain and difficulty urinating.  Endocrine: Denies: hot or cold intolerance, sweats, changes in hair or nails, polyuria, polydipsia. Musculoskeletal: Denies myalgias, back pain, joint swelling, arthralgias and gait problem.  Skin: Denies pallor, rash and wound.  Neurological: Denies dizziness, seizures, syncope, weakness, light-headedness, numbness and headaches.  Hematological: Denies adenopathy. Easy bruising, personal or family bleeding history  Psychiatric/Behavioral: Denies suicidal ideation, mood changes, confusion, nervousness, sleep disturbance and agitation    Physical Exam: Vitals:   01/09/21 0807  BP: 120/82  Pulse: 80  Temp: 97.8 F (36.6 C)  TempSrc: Oral  SpO2: 98%  Weight: 184 lb (83.5 kg)    There is no height or weight on file to calculate BMI.   Constitutional: NAD, calm, comfortable Eyes: PERRL, lids and conjunctivae normal ENMT: Mucous membranes are moist. . Tympanic membrane is pearly white, no erythema or bulging. Neck: normal, supple, no masses, no thyromegaly Respiratory: She has slightly decreased breath sounds to the right upper and middle lobes.  No crackles,  no wheezing.  Normal respiratory effort. No accessory muscle use.  Cardiovascular: Regular rate and rhythm, no murmurs / rubs / gallops. No extremity edema. 2+ pedal pulses. No carotid bruits.    Impression and Plan:  Subacute cough  - Plan:  benzonatate (TESSALON) 100 MG capsule, albuterol (VENTOLIN HFA) 108 (90 Base) MCG/ACT inhaler, DG Chest 2 View -I think it is time that we do a chest x-ray to rule out pneumonia although I think this is unlikely. -Will prescribe Tessalon Perles for cough and an albuterol inhaler as I feel she might have some bronchospasm from continued coughing that might help her improve symptomatically. -Follow-up with results of x-rays to see if further antibiotics are needed.  Time spent: 30 minutes reviewing chart, interviewing and examining patient and formulating plan of care.   Patient Instructions  -Nice seeing you today!!  -Xray today; will notify you of results.  -Tessalon perles 1 tablet every 12 hours as needed for cough.  -May use albuterol inhaler as needed for cough and wheezing: 2 puffs every 6 hours as needed.    Chaya Jan, MD [facility] at [facility]

## 2021-01-09 NOTE — Patient Instructions (Signed)
-  Nice seeing you today!!  -Xray today; will notify you of results.  -Tessalon perles 1 tablet every 12 hours as needed for cough.  -May use albuterol inhaler as needed for cough and wheezing: 2 puffs every 6 hours as needed.

## 2021-01-31 ENCOUNTER — Other Ambulatory Visit: Payer: Self-pay | Admitting: Internal Medicine

## 2021-01-31 DIAGNOSIS — R052 Subacute cough: Secondary | ICD-10-CM

## 2021-02-20 DIAGNOSIS — D649 Anemia, unspecified: Secondary | ICD-10-CM | POA: Diagnosis not present

## 2021-02-20 DIAGNOSIS — E559 Vitamin D deficiency, unspecified: Secondary | ICD-10-CM | POA: Diagnosis not present

## 2021-02-20 DIAGNOSIS — E038 Other specified hypothyroidism: Secondary | ICD-10-CM | POA: Diagnosis not present

## 2021-02-20 DIAGNOSIS — E59 Dietary selenium deficiency: Secondary | ICD-10-CM | POA: Diagnosis not present

## 2021-02-20 DIAGNOSIS — E279 Disorder of adrenal gland, unspecified: Secondary | ICD-10-CM | POA: Diagnosis not present

## 2021-02-20 DIAGNOSIS — E2839 Other primary ovarian failure: Secondary | ICD-10-CM | POA: Diagnosis not present

## 2021-03-25 DIAGNOSIS — F330 Major depressive disorder, recurrent, mild: Secondary | ICD-10-CM | POA: Diagnosis not present

## 2021-03-25 DIAGNOSIS — R945 Abnormal results of liver function studies: Secondary | ICD-10-CM | POA: Diagnosis not present

## 2021-03-25 DIAGNOSIS — E729 Disorder of amino-acid metabolism, unspecified: Secondary | ICD-10-CM | POA: Diagnosis not present

## 2021-03-25 DIAGNOSIS — D649 Anemia, unspecified: Secondary | ICD-10-CM | POA: Diagnosis not present

## 2021-09-10 DIAGNOSIS — M2391 Unspecified internal derangement of right knee: Secondary | ICD-10-CM | POA: Diagnosis not present

## 2021-09-12 DIAGNOSIS — Z1329 Encounter for screening for other suspected endocrine disorder: Secondary | ICD-10-CM | POA: Diagnosis not present

## 2021-09-12 DIAGNOSIS — N951 Menopausal and female climacteric states: Secondary | ICD-10-CM | POA: Diagnosis not present

## 2021-09-18 DIAGNOSIS — G4700 Insomnia, unspecified: Secondary | ICD-10-CM | POA: Diagnosis not present

## 2021-09-18 DIAGNOSIS — Z130 Encounter for screening for diseases of the blood and blood-forming organs and certain disorders involving the immune mechanism: Secondary | ICD-10-CM | POA: Diagnosis not present

## 2021-09-18 DIAGNOSIS — Z1329 Encounter for screening for other suspected endocrine disorder: Secondary | ICD-10-CM | POA: Diagnosis not present

## 2021-09-18 DIAGNOSIS — Z1339 Encounter for screening examination for other mental health and behavioral disorders: Secondary | ICD-10-CM | POA: Diagnosis not present

## 2021-09-18 DIAGNOSIS — E7800 Pure hypercholesterolemia, unspecified: Secondary | ICD-10-CM | POA: Diagnosis not present

## 2021-09-18 DIAGNOSIS — R635 Abnormal weight gain: Secondary | ICD-10-CM | POA: Diagnosis not present

## 2021-09-18 DIAGNOSIS — Z131 Encounter for screening for diabetes mellitus: Secondary | ICD-10-CM | POA: Diagnosis not present

## 2021-09-18 DIAGNOSIS — F329 Major depressive disorder, single episode, unspecified: Secondary | ICD-10-CM | POA: Diagnosis not present

## 2021-09-18 DIAGNOSIS — N943 Premenstrual tension syndrome: Secondary | ICD-10-CM | POA: Diagnosis not present

## 2021-09-18 DIAGNOSIS — N951 Menopausal and female climacteric states: Secondary | ICD-10-CM | POA: Diagnosis not present

## 2021-09-18 DIAGNOSIS — Z1331 Encounter for screening for depression: Secondary | ICD-10-CM | POA: Diagnosis not present

## 2021-10-01 DIAGNOSIS — M2391 Unspecified internal derangement of right knee: Secondary | ICD-10-CM | POA: Diagnosis not present

## 2021-10-01 DIAGNOSIS — M5441 Lumbago with sciatica, right side: Secondary | ICD-10-CM | POA: Diagnosis not present

## 2021-10-08 DIAGNOSIS — R102 Pelvic and perineal pain: Secondary | ICD-10-CM | POA: Diagnosis not present

## 2021-10-11 ENCOUNTER — Other Ambulatory Visit: Payer: Self-pay | Admitting: Orthopedic Surgery

## 2021-10-11 DIAGNOSIS — M2391 Unspecified internal derangement of right knee: Secondary | ICD-10-CM

## 2021-10-15 DIAGNOSIS — R102 Pelvic and perineal pain: Secondary | ICD-10-CM | POA: Diagnosis not present

## 2021-10-15 DIAGNOSIS — M948X8 Other specified disorders of cartilage, other site: Secondary | ICD-10-CM | POA: Diagnosis not present

## 2021-10-15 DIAGNOSIS — M23303 Other meniscus derangements, unspecified medial meniscus, right knee: Secondary | ICD-10-CM | POA: Diagnosis not present

## 2021-10-15 DIAGNOSIS — S8991XA Unspecified injury of right lower leg, initial encounter: Secondary | ICD-10-CM | POA: Diagnosis not present

## 2021-10-15 DIAGNOSIS — M23300 Other meniscus derangements, unspecified lateral meniscus, right knee: Secondary | ICD-10-CM | POA: Diagnosis not present

## 2021-10-16 DIAGNOSIS — R5382 Chronic fatigue, unspecified: Secondary | ICD-10-CM | POA: Diagnosis not present

## 2021-10-16 DIAGNOSIS — N951 Menopausal and female climacteric states: Secondary | ICD-10-CM | POA: Diagnosis not present

## 2021-10-17 DIAGNOSIS — D252 Subserosal leiomyoma of uterus: Secondary | ICD-10-CM | POA: Diagnosis not present

## 2021-10-17 DIAGNOSIS — D251 Intramural leiomyoma of uterus: Secondary | ICD-10-CM | POA: Diagnosis not present

## 2021-10-17 DIAGNOSIS — M545 Low back pain, unspecified: Secondary | ICD-10-CM | POA: Diagnosis not present

## 2021-10-17 DIAGNOSIS — N83292 Other ovarian cyst, left side: Secondary | ICD-10-CM | POA: Diagnosis not present

## 2021-10-18 DIAGNOSIS — R102 Pelvic and perineal pain: Secondary | ICD-10-CM | POA: Diagnosis not present

## 2021-10-18 DIAGNOSIS — R350 Frequency of micturition: Secondary | ICD-10-CM | POA: Diagnosis not present

## 2021-10-18 DIAGNOSIS — N9489 Other specified conditions associated with female genital organs and menstrual cycle: Secondary | ICD-10-CM | POA: Diagnosis not present

## 2021-10-18 DIAGNOSIS — K5900 Constipation, unspecified: Secondary | ICD-10-CM | POA: Diagnosis not present

## 2021-10-18 DIAGNOSIS — N393 Stress incontinence (female) (male): Secondary | ICD-10-CM | POA: Diagnosis not present

## 2021-10-25 DIAGNOSIS — M2550 Pain in unspecified joint: Secondary | ICD-10-CM | POA: Diagnosis not present

## 2021-10-25 DIAGNOSIS — Z6839 Body mass index (BMI) 39.0-39.9, adult: Secondary | ICD-10-CM | POA: Diagnosis not present

## 2021-10-25 DIAGNOSIS — M23203 Derangement of unspecified medial meniscus due to old tear or injury, right knee: Secondary | ICD-10-CM | POA: Diagnosis not present

## 2021-10-25 DIAGNOSIS — N951 Menopausal and female climacteric states: Secondary | ICD-10-CM | POA: Diagnosis not present

## 2021-10-25 DIAGNOSIS — M1711 Unilateral primary osteoarthritis, right knee: Secondary | ICD-10-CM | POA: Diagnosis not present

## 2021-10-25 DIAGNOSIS — R5382 Chronic fatigue, unspecified: Secondary | ICD-10-CM | POA: Diagnosis not present

## 2021-10-26 ENCOUNTER — Other Ambulatory Visit: Payer: BC Managed Care – PPO

## 2021-10-29 DIAGNOSIS — E7800 Pure hypercholesterolemia, unspecified: Secondary | ICD-10-CM | POA: Diagnosis not present

## 2021-11-08 DIAGNOSIS — F4322 Adjustment disorder with anxiety: Secondary | ICD-10-CM | POA: Diagnosis not present

## 2021-11-15 DIAGNOSIS — F4322 Adjustment disorder with anxiety: Secondary | ICD-10-CM | POA: Diagnosis not present

## 2021-11-19 DIAGNOSIS — G4700 Insomnia, unspecified: Secondary | ICD-10-CM | POA: Diagnosis not present

## 2021-11-19 DIAGNOSIS — E7800 Pure hypercholesterolemia, unspecified: Secondary | ICD-10-CM | POA: Diagnosis not present

## 2021-11-29 DIAGNOSIS — M7918 Myalgia, other site: Secondary | ICD-10-CM | POA: Diagnosis not present

## 2021-11-29 DIAGNOSIS — D219 Benign neoplasm of connective and other soft tissue, unspecified: Secondary | ICD-10-CM | POA: Diagnosis not present

## 2021-11-29 DIAGNOSIS — N946 Dysmenorrhea, unspecified: Secondary | ICD-10-CM | POA: Diagnosis not present

## 2021-11-29 DIAGNOSIS — N8003 Adenomyosis of the uterus: Secondary | ICD-10-CM | POA: Diagnosis not present

## 2021-11-29 DIAGNOSIS — F4322 Adjustment disorder with anxiety: Secondary | ICD-10-CM | POA: Diagnosis not present

## 2021-12-06 DIAGNOSIS — E7800 Pure hypercholesterolemia, unspecified: Secondary | ICD-10-CM | POA: Diagnosis not present

## 2021-12-23 DIAGNOSIS — F329 Major depressive disorder, single episode, unspecified: Secondary | ICD-10-CM | POA: Diagnosis not present

## 2021-12-23 DIAGNOSIS — N951 Menopausal and female climacteric states: Secondary | ICD-10-CM | POA: Diagnosis not present

## 2021-12-24 DIAGNOSIS — F4322 Adjustment disorder with anxiety: Secondary | ICD-10-CM | POA: Diagnosis not present

## 2021-12-26 DIAGNOSIS — R5382 Chronic fatigue, unspecified: Secondary | ICD-10-CM | POA: Diagnosis not present

## 2021-12-26 DIAGNOSIS — Z79890 Hormone replacement therapy (postmenopausal): Secondary | ICD-10-CM | POA: Diagnosis not present

## 2021-12-26 DIAGNOSIS — M2550 Pain in unspecified joint: Secondary | ICD-10-CM | POA: Diagnosis not present

## 2021-12-26 DIAGNOSIS — N951 Menopausal and female climacteric states: Secondary | ICD-10-CM | POA: Diagnosis not present

## 2022-01-03 DIAGNOSIS — N889 Noninflammatory disorder of cervix uteri, unspecified: Secondary | ICD-10-CM | POA: Insufficient documentation

## 2022-01-03 DIAGNOSIS — N939 Abnormal uterine and vaginal bleeding, unspecified: Secondary | ICD-10-CM | POA: Insufficient documentation

## 2022-01-03 DIAGNOSIS — F4322 Adjustment disorder with anxiety: Secondary | ICD-10-CM | POA: Diagnosis not present

## 2022-01-03 DIAGNOSIS — N8003 Adenomyosis of the uterus: Secondary | ICD-10-CM | POA: Diagnosis not present

## 2022-01-03 DIAGNOSIS — Z3202 Encounter for pregnancy test, result negative: Secondary | ICD-10-CM | POA: Diagnosis not present

## 2022-01-03 HISTORY — DX: Abnormal uterine and vaginal bleeding, unspecified: N93.9

## 2022-01-08 DIAGNOSIS — G4700 Insomnia, unspecified: Secondary | ICD-10-CM | POA: Diagnosis not present

## 2022-01-15 DIAGNOSIS — N3946 Mixed incontinence: Secondary | ICD-10-CM | POA: Diagnosis not present

## 2022-01-15 DIAGNOSIS — M6289 Other specified disorders of muscle: Secondary | ICD-10-CM | POA: Diagnosis not present

## 2022-01-15 DIAGNOSIS — M62838 Other muscle spasm: Secondary | ICD-10-CM | POA: Diagnosis not present

## 2022-01-15 DIAGNOSIS — M6281 Muscle weakness (generalized): Secondary | ICD-10-CM | POA: Diagnosis not present

## 2022-01-17 DIAGNOSIS — F4322 Adjustment disorder with anxiety: Secondary | ICD-10-CM | POA: Diagnosis not present

## 2022-02-11 DIAGNOSIS — N889 Noninflammatory disorder of cervix uteri, unspecified: Secondary | ICD-10-CM | POA: Diagnosis not present

## 2022-02-11 DIAGNOSIS — D390 Neoplasm of uncertain behavior of uterus: Secondary | ICD-10-CM | POA: Diagnosis not present

## 2022-02-11 DIAGNOSIS — L814 Other melanin hyperpigmentation: Secondary | ICD-10-CM | POA: Diagnosis not present

## 2022-02-11 DIAGNOSIS — N939 Abnormal uterine and vaginal bleeding, unspecified: Secondary | ICD-10-CM | POA: Diagnosis not present

## 2022-02-11 DIAGNOSIS — N858 Other specified noninflammatory disorders of uterus: Secondary | ICD-10-CM | POA: Diagnosis not present

## 2022-02-18 DIAGNOSIS — N951 Menopausal and female climacteric states: Secondary | ICD-10-CM | POA: Diagnosis not present

## 2022-02-18 DIAGNOSIS — R5382 Chronic fatigue, unspecified: Secondary | ICD-10-CM | POA: Diagnosis not present

## 2022-02-25 DIAGNOSIS — M62838 Other muscle spasm: Secondary | ICD-10-CM | POA: Diagnosis not present

## 2022-02-25 DIAGNOSIS — N3946 Mixed incontinence: Secondary | ICD-10-CM | POA: Diagnosis not present

## 2022-02-25 DIAGNOSIS — M6289 Other specified disorders of muscle: Secondary | ICD-10-CM | POA: Diagnosis not present

## 2022-02-25 DIAGNOSIS — M6281 Muscle weakness (generalized): Secondary | ICD-10-CM | POA: Diagnosis not present

## 2022-03-04 DIAGNOSIS — N3946 Mixed incontinence: Secondary | ICD-10-CM | POA: Diagnosis not present

## 2022-03-04 DIAGNOSIS — M62838 Other muscle spasm: Secondary | ICD-10-CM | POA: Diagnosis not present

## 2022-03-04 DIAGNOSIS — M6289 Other specified disorders of muscle: Secondary | ICD-10-CM | POA: Diagnosis not present

## 2022-03-04 DIAGNOSIS — M6281 Muscle weakness (generalized): Secondary | ICD-10-CM | POA: Diagnosis not present

## 2022-03-18 DIAGNOSIS — M62838 Other muscle spasm: Secondary | ICD-10-CM | POA: Diagnosis not present

## 2022-03-18 DIAGNOSIS — M6289 Other specified disorders of muscle: Secondary | ICD-10-CM | POA: Diagnosis not present

## 2022-03-18 DIAGNOSIS — M6281 Muscle weakness (generalized): Secondary | ICD-10-CM | POA: Diagnosis not present

## 2022-03-18 DIAGNOSIS — R102 Pelvic and perineal pain: Secondary | ICD-10-CM | POA: Diagnosis not present

## 2022-03-28 DIAGNOSIS — R5382 Chronic fatigue, unspecified: Secondary | ICD-10-CM | POA: Diagnosis not present

## 2022-03-28 DIAGNOSIS — N951 Menopausal and female climacteric states: Secondary | ICD-10-CM | POA: Diagnosis not present

## 2022-04-03 ENCOUNTER — Other Ambulatory Visit (HOSPITAL_COMMUNITY): Payer: Self-pay

## 2022-04-03 DIAGNOSIS — R5382 Chronic fatigue, unspecified: Secondary | ICD-10-CM | POA: Diagnosis not present

## 2022-04-03 DIAGNOSIS — Z79890 Hormone replacement therapy (postmenopausal): Secondary | ICD-10-CM | POA: Diagnosis not present

## 2022-04-03 DIAGNOSIS — M2550 Pain in unspecified joint: Secondary | ICD-10-CM | POA: Diagnosis not present

## 2022-04-03 DIAGNOSIS — N951 Menopausal and female climacteric states: Secondary | ICD-10-CM | POA: Diagnosis not present

## 2022-04-03 MED ORDER — WEGOVY 0.25 MG/0.5ML ~~LOC~~ SOAJ
0.2500 mg | SUBCUTANEOUS | 0 refills | Status: DC
  Filled 2022-04-03: qty 2, 28d supply, fill #0

## 2022-04-04 ENCOUNTER — Other Ambulatory Visit (HOSPITAL_COMMUNITY): Payer: Self-pay

## 2022-04-05 ENCOUNTER — Other Ambulatory Visit (HOSPITAL_COMMUNITY): Payer: Self-pay

## 2022-04-25 DIAGNOSIS — R5382 Chronic fatigue, unspecified: Secondary | ICD-10-CM | POA: Diagnosis not present

## 2022-04-25 DIAGNOSIS — Z5181 Encounter for therapeutic drug level monitoring: Secondary | ICD-10-CM | POA: Diagnosis not present

## 2022-04-25 DIAGNOSIS — Z79891 Long term (current) use of opiate analgesic: Secondary | ICD-10-CM | POA: Diagnosis not present

## 2022-04-25 DIAGNOSIS — N951 Menopausal and female climacteric states: Secondary | ICD-10-CM | POA: Diagnosis not present

## 2022-04-25 DIAGNOSIS — R41840 Attention and concentration deficit: Secondary | ICD-10-CM | POA: Diagnosis not present

## 2022-04-25 DIAGNOSIS — Z79899 Other long term (current) drug therapy: Secondary | ICD-10-CM | POA: Diagnosis not present

## 2022-05-02 ENCOUNTER — Other Ambulatory Visit (HOSPITAL_COMMUNITY): Payer: Self-pay

## 2022-05-02 ENCOUNTER — Other Ambulatory Visit: Payer: Self-pay

## 2022-05-02 DIAGNOSIS — R454 Irritability and anger: Secondary | ICD-10-CM | POA: Diagnosis not present

## 2022-05-02 DIAGNOSIS — R5382 Chronic fatigue, unspecified: Secondary | ICD-10-CM | POA: Diagnosis not present

## 2022-05-02 DIAGNOSIS — F419 Anxiety disorder, unspecified: Secondary | ICD-10-CM | POA: Diagnosis not present

## 2022-05-02 DIAGNOSIS — N951 Menopausal and female climacteric states: Secondary | ICD-10-CM | POA: Diagnosis not present

## 2022-05-02 DIAGNOSIS — R41840 Attention and concentration deficit: Secondary | ICD-10-CM | POA: Diagnosis not present

## 2022-05-02 DIAGNOSIS — F338 Other recurrent depressive disorders: Secondary | ICD-10-CM | POA: Diagnosis not present

## 2022-05-02 MED ORDER — WEGOVY 0.5 MG/0.5ML ~~LOC~~ SOAJ
0.5000 mg | SUBCUTANEOUS | 1 refills | Status: DC
  Filled 2022-05-02: qty 2, 28d supply, fill #0

## 2022-05-21 ENCOUNTER — Encounter: Payer: Self-pay | Admitting: Internal Medicine

## 2022-05-22 ENCOUNTER — Other Ambulatory Visit (HOSPITAL_COMMUNITY): Payer: Self-pay

## 2022-05-22 DIAGNOSIS — M2550 Pain in unspecified joint: Secondary | ICD-10-CM | POA: Diagnosis not present

## 2022-05-22 DIAGNOSIS — Z6835 Body mass index (BMI) 35.0-35.9, adult: Secondary | ICD-10-CM | POA: Diagnosis not present

## 2022-05-22 DIAGNOSIS — E7800 Pure hypercholesterolemia, unspecified: Secondary | ICD-10-CM | POA: Diagnosis not present

## 2022-05-22 MED ORDER — ZEPBOUND 2.5 MG/0.5ML ~~LOC~~ SOAJ
2.5000 mg | SUBCUTANEOUS | 0 refills | Status: DC
  Filled 2022-05-22: qty 2, 28d supply, fill #0

## 2022-05-23 ENCOUNTER — Other Ambulatory Visit (HOSPITAL_COMMUNITY): Payer: Self-pay

## 2022-05-27 ENCOUNTER — Other Ambulatory Visit (HOSPITAL_COMMUNITY): Payer: Self-pay

## 2022-05-29 ENCOUNTER — Other Ambulatory Visit (HOSPITAL_COMMUNITY): Payer: Self-pay

## 2022-05-29 ENCOUNTER — Ambulatory Visit: Payer: BC Managed Care – PPO | Admitting: Internal Medicine

## 2022-05-29 ENCOUNTER — Ambulatory Visit (INDEPENDENT_AMBULATORY_CARE_PROVIDER_SITE_OTHER): Payer: BC Managed Care – PPO

## 2022-05-29 ENCOUNTER — Encounter: Payer: Self-pay | Admitting: Internal Medicine

## 2022-05-29 VITALS — BP 110/80 | HR 70 | Temp 98.9°F | Ht 61.0 in | Wt 189.8 lb

## 2022-05-29 DIAGNOSIS — R0789 Other chest pain: Secondary | ICD-10-CM

## 2022-05-29 NOTE — Progress Notes (Signed)
     Established Patient Office Visit     CC/Reason for Visit: Chronic anterior chest wall pain  HPI: Rebekah Baker is a 47 y.o. female who is coming in today for the above mentioned reasons.  Per patient this has been present for 2 to 3 years.  No injury that she can recall.  Pain is located in the anterior chest wall at about the area of the second rib.  It is particularly painful while lying down on the left side of her chest.  Does not hurt with deep inspiration.  There is no association with food or with exertion.  Sometimes when she has a URI and is coughing pain can be exquisite.  She is wanting an MRI to have this investigated.   Past Medical/Surgical History: Past Medical History:  Diagnosis Date   Depression     Past Surgical History:  Procedure Laterality Date   APPENDECTOMY     BREAST SURGERY      Social History:  reports that she has never smoked. She has never used smokeless tobacco. She reports current alcohol use. She reports that she does not use drugs.  Allergies: No Known Allergies  Family History:  No history of heart disease, cancer, stroke that she is aware of   Current Outpatient Medications:    buPROPion (WELLBUTRIN SR) 200 MG 12 hr tablet, Take 200 mg by mouth daily., Disp: , Rfl:    Semaglutide-Weight Management (WEGOVY) 0.25 MG/0.5ML SOAJ, Inject 0.25 mg into the skin once a week., Disp: 2 mL, Rfl: 0   Semaglutide-Weight Management (WEGOVY) 0.5 MG/0.5ML SOAJ, Inject 0.5 mg into the skin once a week., Disp: 2 mL, Rfl: 1  Review of Systems:  Negative unless indicated in HPI.   Physical Exam: Vitals:   05/29/22 0838  BP: 110/80  Pulse: 70  Temp: 98.9 F (37.2 C)  TempSrc: Oral  SpO2: 99%  Weight: 189 lb 12.8 oz (86.1 kg)  Height: 5\' 1"  (1.549 m)    Body mass index is 35.86 kg/m.   Physical Exam Vitals reviewed.  Constitutional:      Appearance: Normal appearance.  HENT:     Right Ear: Hearing normal.     Left Ear: Hearing  normal.     Mouth/Throat:     Mouth: Mucous membranes are moist.     Pharynx: Oropharynx is clear.  Eyes:     Conjunctiva/sclera: Conjunctivae normal.     Pupils: Pupils are equal, round, and reactive to light.  Cardiovascular:     Rate and Rhythm: Normal rate and regular rhythm.  Pulmonary:     Effort: Pulmonary effort is normal.     Breath sounds: Normal breath sounds.  Chest:       Comments: Slight pain to palpation in the anterior chest wall approximately at the second or third rib cage area.  No deformity noted. Neurological:     Mental Status: She is alert.      Impression and Plan:  Left-sided chest wall pain -     DG Ribs Bilateral; Future   -Wonder about old second or third anterior left rib fracture/musculoskeletal problem.  Start workup with rib detail film.   Time spent:30 minutes have been spent today reviewing chart, interviewing and examining patient and formulating plan of care.  We have decided to start with chest x-ray to evaluate her left anterior chest wall pain.     Chaya Jan, MD [facility] at [facility]

## 2022-06-03 ENCOUNTER — Encounter: Payer: Self-pay | Admitting: Internal Medicine

## 2022-06-03 ENCOUNTER — Other Ambulatory Visit: Payer: Self-pay | Admitting: Internal Medicine

## 2022-06-03 DIAGNOSIS — R0789 Other chest pain: Secondary | ICD-10-CM

## 2022-06-04 ENCOUNTER — Other Ambulatory Visit (HOSPITAL_COMMUNITY): Payer: Self-pay

## 2022-06-04 ENCOUNTER — Ambulatory Visit: Payer: BC Managed Care – PPO | Admitting: Internal Medicine

## 2022-06-04 ENCOUNTER — Other Ambulatory Visit: Payer: Self-pay

## 2022-06-04 DIAGNOSIS — R41840 Attention and concentration deficit: Secondary | ICD-10-CM | POA: Diagnosis not present

## 2022-06-04 DIAGNOSIS — Z6835 Body mass index (BMI) 35.0-35.9, adult: Secondary | ICD-10-CM | POA: Diagnosis not present

## 2022-06-04 DIAGNOSIS — E7800 Pure hypercholesterolemia, unspecified: Secondary | ICD-10-CM | POA: Diagnosis not present

## 2022-06-04 MED ORDER — WEGOVY 1 MG/0.5ML ~~LOC~~ SOAJ
1.0000 mg | SUBCUTANEOUS | 0 refills | Status: DC
  Filled 2022-06-04: qty 2, 28d supply, fill #0

## 2022-06-05 ENCOUNTER — Ambulatory Visit: Payer: BC Managed Care – PPO | Admitting: Internal Medicine

## 2022-06-05 ENCOUNTER — Encounter: Payer: Self-pay | Admitting: Internal Medicine

## 2022-06-05 VITALS — BP 126/96 | HR 113 | Temp 97.5°F | Ht 61.0 in | Wt 189.4 lb

## 2022-06-05 DIAGNOSIS — N3000 Acute cystitis without hematuria: Secondary | ICD-10-CM

## 2022-06-05 DIAGNOSIS — R300 Dysuria: Secondary | ICD-10-CM

## 2022-06-05 LAB — POC URINALSYSI DIPSTICK (AUTOMATED)
Bilirubin, UA: POSITIVE
Blood, UA: NEGATIVE
Glucose, UA: NEGATIVE
Ketones, UA: POSITIVE
Nitrite, UA: NEGATIVE
Protein, UA: POSITIVE — AB
Spec Grav, UA: 1.015 (ref 1.010–1.025)
Urobilinogen, UA: 1 E.U./dL
pH, UA: 6 (ref 5.0–8.0)

## 2022-06-05 MED ORDER — SULFAMETHOXAZOLE-TRIMETHOPRIM 800-160 MG PO TABS
1.0000 | ORAL_TABLET | Freq: Two times a day (BID) | ORAL | 0 refills | Status: AC
Start: 2022-06-05 — End: 2022-06-10

## 2022-06-05 NOTE — Progress Notes (Signed)
     Established Patient Office Visit     CC/Reason for Visit: "I think I have a UTI"  HPI: Rebekah Baker is a 47 y.o. female who is coming in today for the above mentioned reasons.  For the past 4 days has been experiencing dysuria, frequency and sensation of incomplete bladder emptying.  He is here today for evaluation.   Past Medical/Surgical History: Past Medical History:  Diagnosis Date   Depression     Past Surgical History:  Procedure Laterality Date   APPENDECTOMY     BREAST SURGERY      Social History:  reports that she has never smoked. She has never used smokeless tobacco. She reports current alcohol use. She reports that she does not use drugs.  Allergies: No Known Allergies  Family History:  History reviewed. No pertinent family history.   Current Outpatient Medications:    buPROPion (WELLBUTRIN SR) 200 MG 12 hr tablet, Take 200 mg by mouth daily., Disp: , Rfl:    Semaglutide-Weight Management (WEGOVY) 0.25 MG/0.5ML SOAJ, Inject 0.25 mg into the skin once a week., Disp: 2 mL, Rfl: 0   Semaglutide-Weight Management (WEGOVY) 0.5 MG/0.5ML SOAJ, Inject 0.5 mg into the skin once a week., Disp: 2 mL, Rfl: 1   Semaglutide-Weight Management (WEGOVY) 1 MG/0.5ML SOAJ, Inject 1 mg into the skin once a week., Disp: 2 mL, Rfl: 0   sulfamethoxazole-trimethoprim (BACTRIM DS) 800-160 MG tablet, Take 1 tablet by mouth 2 (two) times daily for 5 days., Disp: 10 tablet, Rfl: 0  Review of Systems:  Negative unless indicated in HPI.   Physical Exam: Vitals:   06/05/22 0952  BP: (!) 126/96  Pulse: (!) 113  Temp: (!) 97.5 F (36.4 C)  TempSrc: Oral  SpO2: 97%  Weight: 189 lb 6.4 oz (85.9 kg)  Height: 5\' 1"  (1.549 m)    Body mass index is 35.79 kg/m.   Physical Exam Vitals reviewed.  Constitutional:      Appearance: Normal appearance.  HENT:     Head: Normocephalic and atraumatic.  Eyes:     Conjunctiva/sclera: Conjunctivae normal.     Pupils: Pupils are  equal, round, and reactive to light.  Skin:    General: Skin is warm and dry.  Neurological:     General: No focal deficit present.     Mental Status: She is alert and oriented to person, place, and time.  Psychiatric:        Mood and Affect: Mood normal.        Behavior: Behavior normal.        Thought Content: Thought content normal.        Judgment: Judgment normal.      Impression and Plan:  Acute cystitis without hematuria -     Sulfamethoxazole-Trimethoprim; Take 1 tablet by mouth 2 (two) times daily for 5 days.  Dispense: 10 tablet; Refill: 0  Dysuria -     POCT Urinalysis Dipstick (Automated) -     Urine Culture; Future  -In office culture positive for leukocytes and nitrates.  Start Bactrim DS for 5 days, send for urine culture.   Time spent:21 minutes reviewing chart, interviewing and examining patient and formulating plan of care.     Chaya Jan, MD [facility] at [facility]

## 2022-06-06 ENCOUNTER — Other Ambulatory Visit (HOSPITAL_COMMUNITY): Payer: Self-pay

## 2022-06-06 DIAGNOSIS — F902 Attention-deficit hyperactivity disorder, combined type: Secondary | ICD-10-CM | POA: Diagnosis not present

## 2022-06-06 DIAGNOSIS — M25571 Pain in right ankle and joints of right foot: Secondary | ICD-10-CM | POA: Diagnosis not present

## 2022-06-06 DIAGNOSIS — M1711 Unilateral primary osteoarthritis, right knee: Secondary | ICD-10-CM | POA: Diagnosis not present

## 2022-06-06 DIAGNOSIS — R41840 Attention and concentration deficit: Secondary | ICD-10-CM | POA: Diagnosis not present

## 2022-06-06 DIAGNOSIS — Z79899 Other long term (current) drug therapy: Secondary | ICD-10-CM | POA: Diagnosis not present

## 2022-06-07 LAB — URINE CULTURE
MICRO NUMBER:: 14965596
SPECIMEN QUALITY:: ADEQUATE

## 2022-06-19 DIAGNOSIS — M25571 Pain in right ankle and joints of right foot: Secondary | ICD-10-CM | POA: Diagnosis not present

## 2022-06-20 DIAGNOSIS — F902 Attention-deficit hyperactivity disorder, combined type: Secondary | ICD-10-CM | POA: Diagnosis not present

## 2022-06-20 DIAGNOSIS — Z79899 Other long term (current) drug therapy: Secondary | ICD-10-CM | POA: Diagnosis not present

## 2022-06-20 DIAGNOSIS — F419 Anxiety disorder, unspecified: Secondary | ICD-10-CM | POA: Diagnosis not present

## 2022-06-30 ENCOUNTER — Other Ambulatory Visit (HOSPITAL_COMMUNITY): Payer: Self-pay

## 2022-07-02 ENCOUNTER — Other Ambulatory Visit (HOSPITAL_COMMUNITY): Payer: Self-pay

## 2022-07-08 ENCOUNTER — Other Ambulatory Visit (HOSPITAL_COMMUNITY): Payer: Self-pay

## 2022-07-09 ENCOUNTER — Other Ambulatory Visit (HOSPITAL_COMMUNITY): Payer: Self-pay

## 2022-07-10 ENCOUNTER — Other Ambulatory Visit (HOSPITAL_COMMUNITY): Payer: Self-pay

## 2022-07-10 DIAGNOSIS — F902 Attention-deficit hyperactivity disorder, combined type: Secondary | ICD-10-CM | POA: Diagnosis not present

## 2022-07-10 DIAGNOSIS — E8881 Metabolic syndrome: Secondary | ICD-10-CM | POA: Diagnosis not present

## 2022-07-10 DIAGNOSIS — Z6835 Body mass index (BMI) 35.0-35.9, adult: Secondary | ICD-10-CM | POA: Insufficient documentation

## 2022-07-10 DIAGNOSIS — E039 Hypothyroidism, unspecified: Secondary | ICD-10-CM | POA: Diagnosis not present

## 2022-07-10 MED ORDER — ZEPBOUND 2.5 MG/0.5ML ~~LOC~~ SOAJ
SUBCUTANEOUS | 0 refills | Status: DC
  Filled 2022-07-10: qty 2, 28d supply, fill #0

## 2022-07-11 ENCOUNTER — Ambulatory Visit (HOSPITAL_BASED_OUTPATIENT_CLINIC_OR_DEPARTMENT_OTHER)
Admission: RE | Admit: 2022-07-11 | Discharge: 2022-07-11 | Disposition: A | Discharge status: Home / Self Care | Payer: BC Managed Care – PPO | Source: Ambulatory Visit | Attending: Internal Medicine | Admitting: Internal Medicine

## 2022-07-11 DIAGNOSIS — R079 Chest pain, unspecified: Secondary | ICD-10-CM | POA: Diagnosis not present

## 2022-07-11 DIAGNOSIS — R0789 Other chest pain: Secondary | ICD-10-CM | POA: Diagnosis not present

## 2022-07-11 MED ORDER — GADOBUTROL 1 MMOL/ML IV SOLN
6.0000 mL | Freq: Once | INTRAVENOUS | Status: AC | PRN
  Administered 2022-07-11: 6 mL via INTRAVENOUS
  Filled 2022-07-11: qty 6

## 2022-07-14 DIAGNOSIS — M25571 Pain in right ankle and joints of right foot: Secondary | ICD-10-CM | POA: Diagnosis not present

## 2022-07-14 DIAGNOSIS — M1711 Unilateral primary osteoarthritis, right knee: Secondary | ICD-10-CM | POA: Diagnosis not present

## 2022-07-21 ENCOUNTER — Encounter: Payer: Self-pay | Admitting: Nurse Practitioner

## 2022-07-21 ENCOUNTER — Other Ambulatory Visit (HOSPITAL_COMMUNITY): Payer: Self-pay

## 2022-07-21 ENCOUNTER — Ambulatory Visit: Payer: BC Managed Care – PPO | Admitting: Nurse Practitioner

## 2022-07-21 VITALS — BP 120/84 | HR 98 | Temp 98.1°F | Ht 61.0 in | Wt 187.4 lb

## 2022-07-21 DIAGNOSIS — N889 Noninflammatory disorder of cervix uteri, unspecified: Secondary | ICD-10-CM

## 2022-07-21 DIAGNOSIS — F419 Anxiety disorder, unspecified: Secondary | ICD-10-CM

## 2022-07-21 DIAGNOSIS — F32A Depression, unspecified: Secondary | ICD-10-CM | POA: Diagnosis not present

## 2022-07-21 DIAGNOSIS — Z6835 Body mass index (BMI) 35.0-35.9, adult: Secondary | ICD-10-CM

## 2022-07-21 DIAGNOSIS — Z79899 Other long term (current) drug therapy: Secondary | ICD-10-CM

## 2022-07-21 DIAGNOSIS — Z2821 Immunization not carried out because of patient refusal: Secondary | ICD-10-CM

## 2022-07-21 DIAGNOSIS — Z8639 Personal history of other endocrine, nutritional and metabolic disease: Secondary | ICD-10-CM

## 2022-07-21 DIAGNOSIS — Z1211 Encounter for screening for malignant neoplasm of colon: Secondary | ICD-10-CM

## 2022-07-21 DIAGNOSIS — Z7689 Persons encountering health services in other specified circumstances: Secondary | ICD-10-CM

## 2022-07-21 DIAGNOSIS — R079 Chest pain, unspecified: Secondary | ICD-10-CM | POA: Diagnosis not present

## 2022-07-21 MED ORDER — BUPROPION HCL ER (XL) 300 MG PO TB24: 300.0000 mg | ORAL_TABLET | Freq: Every day | ORAL | 2 refills | Status: DC

## 2022-07-21 MED ORDER — ZEPBOUND 5 MG/0.5ML ~~LOC~~ SOAJ
5.0000 mg | SUBCUTANEOUS | 0 refills | Status: DC
Start: 2022-07-21 — End: 2022-08-31
  Filled 2022-07-21: qty 2, 28d supply, fill #0

## 2022-07-21 NOTE — Progress Notes (Signed)
Madelaine Bhat, CMA,acting as a Neurosurgeon for Arnette Felts, FNP.,have documented all relevant documentation on the behalf of Arnette Felts, FNP,as directed by  Arnette Felts, FNP while in the presence of Arnette Felts, FNP.  Subjective:  Patient ID: Rebekah Baker , female    DOB: 01/04/1976 , 47 y.o.   MRN: [id number]  Chief Complaint  Patient presents with   Establish Care   Depression    HPI  Patient presents today to establish care. She had been seeing Dr. Ardyth Harps, seen last a couple months ago ([facility]). 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 Married. She has 3 children (78 (son), 81 (son) and 32 y/o (daughter). She was referred by a friend who is a patient here.   She is on wellbutrin for her depression (dx for at least 2o years). She had a time where her cholesterol levels were slightly elevated. She is currently on Zepbound last week started with [facility] and is getting pellet therapy. She had been on wegovy without significant benefit. She started [facility] February prior to that tried a compound cream. She has also taking low dose naltraxone medications. She has never tried phentermine due to taking her wellbutrin. She has lost 20 lbs since the start of her weight loss journey. Diet is okay generally favors protein diet. She is eating more fruit than before. Exercising - 3-4 times a week with her peleton. She is trying to get to a place where it will be a habit.   Patient reports she has been having left side chest pain for about 3 years. Will feel like a cramp. Will take 5 minutes to resolve. Occurs anytime lays on her left side. She went 6 months without noticing much. Thought to be a muscle pain. Initially took ibuprofen without relief. Patient reports having xrays and MRIs done but never an EKG. Patient reports her previous PCP stated that it was muscle pains.  Denies any family history of heart problems or chest pain.      Past Medical History:  Diagnosis Date   Depression       Family History  Problem Relation Age of Onset   Hypertension Mother    Hypertension Sister    Asthma Daughter    Asthma Son    ADD / ADHD Son      Current Outpatient Medications:    CYTOMEL 5 MCG tablet, Take 5 mcg by mouth daily. Take 2 tablets by mouth daily., Disp: , Rfl:    tirzepatide (ZEPBOUND) 5 MG/0.5ML Pen, Inject 5 mg into the skin once a week., Disp: 2 mL, Rfl: 0   buPROPion (WELLBUTRIN XL) 300 MG 24 hr tablet, Take 1 tablet (300 mg total) by mouth daily., Disp: 30 tablet, Rfl: 2   No Known Allergies   Review of Systems  Constitutional: Negative.  Negative for activity change and fatigue.  Eyes:  Negative for visual disturbance.  Respiratory: Negative.  Negative for choking, shortness of breath and wheezing.   Cardiovascular: Negative.  Negative for chest pain, palpitations and leg swelling.  Gastrointestinal: Negative.   Endocrine: Negative.  Negative for polydipsia, polyphagia and polyuria.  Musculoskeletal: Negative.   Skin: Negative.   Neurological:  Negative for dizziness, weakness and headaches.  Psychiatric/Behavioral:  Negative for confusion. The patient is not nervous/anxious.      Today's Vitals   07/21/22 1011  BP: 120/84  Pulse: 98  Temp: 98.1 F (36.7 C)  TempSrc: Oral  Weight: 187 lb 6.4 oz (85 kg)  Height: 5\' 1"  (1.549 m)  PainSc: 0-No pain   Body mass index is 35.41 kg/m.  Wt Readings from Last 3 Encounters:  07/21/22 187 lb 6.4 oz (85 kg)  06/05/22 189 lb 6.4 oz (85.9 kg)  05/29/22 189 lb 12.8 oz (86.1 kg)    Body mass index is 35.41 kg/m.   Objective:  Physical Exam Vitals reviewed.  Constitutional:      Appearance: Normal appearance. She is well-developed. She is obese.  HENT:     Head: Normocephalic and atraumatic.  Eyes:     Pupils: Pupils are equal, round, and reactive to light.  Cardiovascular:     Rate and Rhythm: Normal rate and regular rhythm.     Pulses: Normal pulses.     Heart sounds: Normal heart sounds. No  murmur heard. Pulmonary:     Effort: Pulmonary effort is normal. No respiratory distress.     Breath sounds: Normal breath sounds. No wheezing.  Musculoskeletal:        General: Normal range of motion.  Skin:    General: Skin is warm and dry.     Capillary Refill: Capillary refill takes less than 2 seconds.  Neurological:     General: No focal deficit present.     Mental Status: She is alert and oriented to person, place, and time.     Cranial Nerves: No cranial nerve deficit.  Psychiatric:        Mood and Affect: Mood normal.        07/21/2022   10:29 AM 06/05/2022   10:36 AM 05/29/2022    8:44 AM 01/02/2021    9:52 AM 01/05/2019    9:45 AM  Depression screen PHQ 2/9  Decreased Interest 0 0 0 0 0  Down, Depressed, Hopeless 0 0 0 0 0  PHQ - 2 Score 0 0 0 0 0  Altered sleeping 0 0 0 0 0  Tired, decreased energy 0 0 1 0 1  Change in appetite 0 0 1 0 1  Feeling bad or failure about yourself  0 0 0 0 0  Trouble concentrating 0 0 1 0 0  Moving slowly or fidgety/restless 0 0 0 0 0  Suicidal thoughts 0 0 0 0 0  PHQ-9 Score 0 0 3 0 2  Difficult doing work/chores Not difficult at all Not difficult at all Somewhat difficult Not difficult at all        Assessment And Plan:  Establishing care with new doctor, encounter for Assessment & Plan: Patient is here to establish care. Went over patient medical, family, social and surgical history. Reviewed with patient their medications and any allergies  Reviewed with patient their sexual orientation, drug/tobacco and alcohol use Dicussed any new concerns with patient  recommended patient comes in for a physical exam and complete blood work.  Educated patient about the importance of annual screenings and immunizations.  Advised patient to eat a healthy diet along with exercise for atleast 30-45 min atleast 4-5 days of the week.     Anxiety and depression Assessment & Plan: she has been taking Wellbutrin 300 mg, will  continue  Orders: -     buPROPion HCl ER (XL); Take 1 tablet (300 mg total) by mouth daily.  Dispense: 30 tablet; Refill: 2 -     CMP14+EGFR  Chest pain, unspecified type Assessment & Plan: EKG done Sinus Rhythm  WITHIN NORMAL LIMITS HR 66. Will also check her iron levels low iron can cause chest pain. I  am going to refer to Cardiology for further evaluation since her pain has been ongoing for 3 years.   Orders: -     EKG 12-Lead -     CMP14+EGFR -     Iron, TIBC and Ferritin Panel -     Ambulatory referral to Cardiology  Screening for colon cancer Assessment & Plan: According to USPTF Colorectal cancer Screening guidelines. Colonoscopy is recommended every 10 years, starting at age 56 years. Will refer to GI for colon cancer screening.   Orders: -     Ambulatory referral to Gastroenterology  COVID-19 vaccination declined Assessment & Plan: Declines covid 19 vaccine. Discussed risk of covid 54 and if she changes her mind about the vaccine to call the office. Education has been provided regarding the importance of this vaccine but patient still declined. Advised may receive this vaccine at local pharmacy or Health Dept.or vaccine clinic. Aware to provide a copy of the vaccination record if obtained from local pharmacy or Health Dept.  Encouraged to take multivitamin, vitamin d, vitamin c and zinc to increase immune system. Aware can call office if would like to have vaccine here at office. Verbalized acceptance and understanding.    History of hypothyroidism Assessment & Plan: Will check thyroid levels.   Orders: -     TSH -     T3, free -     T4 -     Hemoglobin A1c  Body mass index (BMI) 35.0-35.9, adult Assessment & Plan: She is encouraged to strive for BMI less than 30 to decrease cardiac risk. Advised to aim for at least 150 minutes of exercise per week. She has been doing well with her weight loss.    Orders: -     Hemoglobin A1c -     Lipid panel -     Zepbound;  Inject 5 mg into the skin once a week.  Dispense: 2 mL; Refill: 0  Other long term (current) drug therapy -     CMP14+EGFR -     CBC   Return for phy 2 months with weight check.   Patient was given opportunity to ask questions. Patient verbalized understanding of the plan and was able to repeat key elements of the plan. All questions were answered to their satisfaction.    Jeanell Sparrow, FNP, have reviewed all documentation for this visit. The documentation on 07/21/22 for the exam, diagnosis, procedures, and orders are all accurate and complete.   IF YOU HAVE BEEN REFERRED TO A SPECIALIST, IT MAY TAKE 1-2 WEEKS TO SCHEDULE/PROCESS THE REFERRAL. IF YOU HAVE NOT HEARD FROM US/SPECIALIST IN TWO WEEKS, PLEASE GIVE [place] A CALL AT [phone].

## 2022-07-22 ENCOUNTER — Other Ambulatory Visit (HOSPITAL_COMMUNITY): Payer: Self-pay

## 2022-07-22 ENCOUNTER — Other Ambulatory Visit: Payer: Self-pay

## 2022-07-22 LAB — CBC
Hematocrit: 42.6 % (ref 34.0–46.6)
Hemoglobin: 14.1 g/dL (ref 11.1–15.9)
MCH: 32 pg (ref 26.6–33.0)
MCHC: 33.1 g/dL (ref 31.5–35.7)
MCV: 97 fL (ref 79–97)
Platelets: 283 10*3/uL (ref 150–450)
RBC: 4.41 x10E6/uL (ref 3.77–5.28)
RDW: 12.5 % (ref 11.7–15.4)
WBC: 5.5 10*3/uL (ref 3.4–10.8)

## 2022-07-22 LAB — TSH: TSH: 0.901 u[IU]/mL (ref 0.450–4.500)

## 2022-07-22 LAB — IRON,TIBC AND FERRITIN PANEL
Ferritin: 83 ng/mL (ref 15–150)
Iron Saturation: 31 % (ref 15–55)
Iron: 71 ug/dL (ref 27–159)
Total Iron Binding Capacity: 232 ug/dL — ABNORMAL LOW (ref 250–450)
UIBC: 161 ug/dL (ref 131–425)

## 2022-07-22 LAB — CMP14+EGFR
ALT: 16 IU/L (ref 0–32)
AST: 17 IU/L (ref 0–40)
Albumin: 4.2 g/dL (ref 3.9–4.9)
Alkaline Phosphatase: 52 IU/L (ref 44–121)
BUN/Creatinine Ratio: 11 (ref 9–23)
BUN: 9 mg/dL (ref 6–24)
Bilirubin Total: 0.3 mg/dL (ref 0.0–1.2)
CO2: 25 mmol/L (ref 20–29)
Calcium: 9.3 mg/dL (ref 8.7–10.2)
Chloride: 103 mmol/L (ref 96–106)
Creatinine, Ser: 0.84 mg/dL (ref 0.57–1.00)
Globulin, Total: 2.3 g/dL (ref 1.5–4.5)
Glucose: 75 mg/dL (ref 70–99)
Potassium: 4.5 mmol/L (ref 3.5–5.2)
Sodium: 139 mmol/L (ref 134–144)
Total Protein: 6.5 g/dL (ref 6.0–8.5)
eGFR: 86 mL/min/{1.73_m2} (ref >59)

## 2022-07-22 LAB — LIPID PANEL
Chol/HDL Ratio: 3 ratio (ref 0.0–4.4)
Cholesterol, Total: 176 mg/dL (ref 100–199)
HDL: 59 mg/dL (ref >39)
LDL Chol Calc (NIH): 105 mg/dL — ABNORMAL HIGH (ref 0–99)
Triglycerides: 61 mg/dL (ref 0–149)
VLDL Cholesterol Cal: 12 mg/dL (ref 5–40)

## 2022-07-22 LAB — T3, FREE: T3, Free: 4.1 pg/mL (ref 2.0–4.4)

## 2022-07-22 LAB — HEMOGLOBIN A1C
Est. average glucose Bld gHb Est-mCnc: 100 mg/dL
Hgb A1c MFr Bld: 5.1 % (ref 4.8–5.6)

## 2022-07-22 LAB — T4: T4, Total: 5.1 ug/dL (ref 4.5–12.0)

## 2022-07-28 ENCOUNTER — Encounter: Payer: Self-pay | Admitting: Nurse Practitioner

## 2022-07-29 ENCOUNTER — Other Ambulatory Visit (HOSPITAL_COMMUNITY): Payer: Self-pay

## 2022-07-31 ENCOUNTER — Ambulatory Visit: Payer: Self-pay | Admitting: Nurse Practitioner

## 2022-08-05 DIAGNOSIS — Z7689 Persons encountering health services in other specified circumstances: Secondary | ICD-10-CM | POA: Insufficient documentation

## 2022-08-05 DIAGNOSIS — Z8639 Personal history of other endocrine, nutritional and metabolic disease: Secondary | ICD-10-CM | POA: Insufficient documentation

## 2022-08-05 DIAGNOSIS — Z2821 Immunization not carried out because of patient refusal: Secondary | ICD-10-CM | POA: Insufficient documentation

## 2022-08-05 DIAGNOSIS — R079 Chest pain, unspecified: Secondary | ICD-10-CM

## 2022-08-05 DIAGNOSIS — Z1211 Encounter for screening for malignant neoplasm of colon: Secondary | ICD-10-CM | POA: Insufficient documentation

## 2022-08-05 HISTORY — DX: Chest pain, unspecified: R07.9

## 2022-08-05 NOTE — Assessment & Plan Note (Signed)
Will check thyroid levels

## 2022-08-05 NOTE — Assessment & Plan Note (Signed)
EKG done Sinus Rhythm  WITHIN NORMAL LIMITS HR 66. Will also check her iron levels low iron can cause chest pain. I am going to refer to Cardiology for further evaluation since her pain has been ongoing for 3 years.

## 2022-08-05 NOTE — Assessment & Plan Note (Signed)

## 2022-08-05 NOTE — Assessment & Plan Note (Signed)

## 2022-08-05 NOTE — Assessment & Plan Note (Signed)
She is encouraged to strive for BMI less than 30 to decrease cardiac risk. Advised to aim for at least 150 minutes of exercise per week. She has been doing well with her weight loss.

## 2022-08-05 NOTE — Assessment & Plan Note (Signed)
 According to USPTF Colorectal cancer Screening guidelines. Colonoscopy is recommended every 10 years, starting at age 48 years. Will refer to GI for colon cancer screening.

## 2022-08-05 NOTE — Assessment & Plan Note (Signed)
she has been taking Wellbutrin 300 mg, will continue

## 2022-08-31 ENCOUNTER — Other Ambulatory Visit: Payer: Self-pay | Admitting: Nurse Practitioner

## 2022-08-31 DIAGNOSIS — Z6835 Body mass index (BMI) 35.0-35.9, adult: Secondary | ICD-10-CM

## 2022-09-01 ENCOUNTER — Other Ambulatory Visit (HOSPITAL_COMMUNITY): Payer: Self-pay

## 2022-09-01 MED ORDER — ZEPBOUND 5 MG/0.5ML ~~LOC~~ SOAJ
5.0000 mg | SUBCUTANEOUS | 0 refills | Status: DC
Start: 2022-09-01 — End: 2022-10-16
  Filled 2022-09-01: qty 2, 28d supply, fill #0

## 2022-09-02 ENCOUNTER — Other Ambulatory Visit (HOSPITAL_COMMUNITY): Payer: Self-pay

## 2022-09-02 DIAGNOSIS — M1711 Unilateral primary osteoarthritis, right knee: Secondary | ICD-10-CM | POA: Diagnosis not present

## 2022-09-03 ENCOUNTER — Ambulatory Visit: Payer: BC Managed Care – PPO | Admitting: Cardiology

## 2022-09-11 ENCOUNTER — Other Ambulatory Visit: Payer: Self-pay

## 2022-09-11 ENCOUNTER — Encounter: Payer: Self-pay | Admitting: Internal Medicine

## 2022-09-11 DIAGNOSIS — E2839 Other primary ovarian failure: Secondary | ICD-10-CM

## 2022-09-11 DIAGNOSIS — E282 Polycystic ovarian syndrome: Secondary | ICD-10-CM

## 2022-09-12 DIAGNOSIS — Z79899 Other long term (current) drug therapy: Secondary | ICD-10-CM | POA: Diagnosis not present

## 2022-09-12 DIAGNOSIS — F902 Attention-deficit hyperactivity disorder, combined type: Secondary | ICD-10-CM | POA: Diagnosis not present

## 2022-09-12 DIAGNOSIS — M1711 Unilateral primary osteoarthritis, right knee: Secondary | ICD-10-CM | POA: Diagnosis not present

## 2022-09-15 ENCOUNTER — Other Ambulatory Visit: Payer: BC Managed Care – PPO

## 2022-09-15 DIAGNOSIS — E282 Polycystic ovarian syndrome: Secondary | ICD-10-CM

## 2022-09-15 DIAGNOSIS — E2839 Other primary ovarian failure: Secondary | ICD-10-CM

## 2022-09-15 NOTE — Progress Notes (Signed)
PT had lab drawn today

## 2022-09-16 DIAGNOSIS — F902 Attention-deficit hyperactivity disorder, combined type: Secondary | ICD-10-CM | POA: Diagnosis not present

## 2022-09-17 LAB — FSH/LH
FSH: 24.2 m[IU]/mL
LH: 71 m[IU]/mL

## 2022-09-17 LAB — TESTOSTERONE,FREE AND TOTAL
Testosterone, Free: 0.8 pg/mL (ref 0.0–4.2)
Testosterone: 50 ng/dL (ref 4–50)

## 2022-09-17 LAB — ESTRADIOL: Estradiol: 136 pg/mL

## 2022-09-17 LAB — PROGESTERONE: Progesterone: 0.8 ng/mL

## 2022-09-18 DIAGNOSIS — M1711 Unilateral primary osteoarthritis, right knee: Secondary | ICD-10-CM | POA: Diagnosis not present

## 2022-10-16 ENCOUNTER — Encounter: Payer: Self-pay | Admitting: Nurse Practitioner

## 2022-10-16 ENCOUNTER — Other Ambulatory Visit: Payer: Self-pay | Admitting: Nurse Practitioner

## 2022-10-16 ENCOUNTER — Ambulatory Visit: Payer: Self-pay | Admitting: Nurse Practitioner

## 2022-10-16 DIAGNOSIS — Z6835 Body mass index (BMI) 35.0-35.9, adult: Secondary | ICD-10-CM

## 2022-10-31 ENCOUNTER — Encounter: Payer: BC Managed Care – PPO | Admitting: Internal Medicine

## 2022-10-31 ENCOUNTER — Other Ambulatory Visit (HOSPITAL_COMMUNITY): Payer: Self-pay

## 2022-10-31 ENCOUNTER — Other Ambulatory Visit: Payer: Self-pay | Admitting: Nurse Practitioner

## 2022-11-04 NOTE — Progress Notes (Signed)
Madelaine Bhat, CMA,acting as a Neurosurgeon for Arnette Felts, FNP.,have documented all relevant documentation on the behalf of Arnette Felts, FNP,as directed by  Arnette Felts, FNP while in the presence of Arnette Felts, FNP.  Subjective:    Patient ID: Rebekah Baker , female    DOB: [birth year] , 47 y.o.   MRN: [id number]  Chief Complaint  Patient presents with   Annual Exam    HPI  Patient presents today for HM, Patient reports compliance with medication. Patient denies any chest pain, SOB, or headaches. Patient has no concerns today. Patient reports she hasn't got her colonoscopy because she needs to be cleared by cardiology. She has been established at [facility].   BP Readings from Last 3 Encounters: 11/05/22 : 110/70 07/21/22 : 120/84 06/05/22 : (!) 126/96   Wt Readings from Last 3 Encounters: 11/05/22 : 182 lb 3.2 oz (82.6 kg) 07/21/22 : 187 lb 6.4 oz (85 kg) 06/05/22 : 189 lb 6.4 oz (85.9 kg)       Past Medical History:  Diagnosis Date   Abnormal uterine bleeding (AUB) 01/03/2022   Arthritis 01/20/2021   Chest pain 08/05/2022   Depression      Family History  Problem Relation Age of Onset   Hypertension Mother    Hypertension Sister    Asthma Daughter    Asthma Son    ADD / ADHD Son      Current Outpatient Medications:    lisdexamfetamine (VYVANSE) 20 MG capsule, Take 1 capsule (20 mg total) by mouth daily., Disp: , Rfl:    buPROPion (WELLBUTRIN XL) 150 MG 24 hr tablet, Take 2 tablets (300 mg total) by mouth daily., Disp: , Rfl:    CYTOMEL 5 MCG tablet, Take 2 tablets by mouth daily., Disp: 90 tablet, Rfl: 1   ZEPBOUND 5 MG/0.5ML Pen, INJECT 5MG  INTO THE SKIN ONCE A WEEK (Patient not taking: Reported on 11/05/2022), Disp: 4 mL, Rfl: 0   No Known Allergies    The patient states she uses vasectomy for birth control. Patient's last menstrual period was 10/30/2022 (approximate).. Negative for Dysmenorrhea and Negative for Menorrhagia. Negative for:  breast discharge, breast lump(s), breast pain and breast self exam. Associated symptoms include abnormal vaginal bleeding. Pertinent negatives include abnormal bleeding (hematology), anxiety, decreased libido, depression, difficulty falling sleep, dyspareunia, history of infertility, nocturia, sexual dysfunction, sleep disturbances, urinary incontinence, urinary urgency, vaginal discharge and vaginal itching. Diet regular; she has not had Zepbound in the last 3 weeks does not feel a change. She will forget to eat. The patient states her exercise level is moderate with walking 4 times a week for about 40 minutes. She also has a Peleton but has not been using as often.   The patient's tobacco use is:  Social History   Tobacco Use  Smoking Status Never  Smokeless Tobacco Never   She has been exposed to passive smoke. The patient's alcohol use is:  Social History   Substance and Sexual Activity  Alcohol Use Not Currently   Additional information: Last pap 11/10/2020, next one scheduled for 11/11/2023.    Review of Systems  Constitutional: Negative.   HENT: Negative.    Eyes: Negative.   Respiratory: Negative.    Cardiovascular: Negative.   Gastrointestinal: Negative.   Endocrine: Negative.   Genitourinary: Negative.   Musculoskeletal: Negative.   Skin: Negative.   Allergic/Immunologic: Negative.   Neurological: Negative.   Hematological: Negative.   Psychiatric/Behavioral: Negative.       Today's  Vitals   11/05/22 0921  BP: 110/70  Pulse: 85  Temp: 98.7 F (37.1 C)  TempSrc: Oral  Weight: 182 lb 3.2 oz (82.6 kg)  Height: 5\' 1"  (1.549 m)  PainSc: 0-No pain   Body mass index is 34.43 kg/m.  Wt Readings from Last 3 Encounters:  11/05/22 182 lb 3.2 oz (82.6 kg)  07/21/22 187 lb 6.4 oz (85 kg)  06/05/22 189 lb 6.4 oz (85.9 kg)     Objective:  Physical Exam Vitals reviewed.  Constitutional:      General: She is not in acute distress.    Appearance: Normal appearance.  She is well-developed. She is obese.  HENT:     Head: Normocephalic and atraumatic.     Right Ear: Hearing, tympanic membrane, ear canal and external ear normal. There is no impacted cerumen.     Left Ear: Hearing, tympanic membrane, ear canal and external ear normal. There is no impacted cerumen.     Nose: Nose normal.     Mouth/Throat:     Mouth: Mucous membranes are moist.  Eyes:     General: Lids are normal.     Extraocular Movements: Extraocular movements intact.     Conjunctiva/sclera: Conjunctivae normal.     Pupils: Pupils are equal, round, and reactive to light.     Funduscopic exam:    Right eye: No papilledema.        Left eye: No papilledema.  Neck:     Thyroid: No thyroid mass.     Vascular: No carotid bruit.  Cardiovascular:     Rate and Rhythm: Normal rate and regular rhythm.     Pulses: Normal pulses.     Heart sounds: Normal heart sounds. No murmur heard. Pulmonary:     Effort: Pulmonary effort is normal. No respiratory distress.     Breath sounds: Normal breath sounds. No wheezing.  Chest:     Chest wall: No mass.  Breasts:    Tanner Score is 5.     Right: Normal. No mass or tenderness.     Left: Normal. No mass or tenderness.  Abdominal:     General: Abdomen is flat. Bowel sounds are normal. There is no distension.     Palpations: Abdomen is soft.     Tenderness: There is no abdominal tenderness.  Genitourinary:    Comments: Deferred - followed by GYN Musculoskeletal:        General: No swelling or tenderness. Normal range of motion.     Cervical back: Full passive range of motion without pain, normal range of motion and neck supple.     Right lower leg: No edema.     Left lower leg: No edema.  Lymphadenopathy:     Upper Body:     Right upper body: No supraclavicular, axillary or pectoral adenopathy.     Left upper body: No supraclavicular, axillary or pectoral adenopathy.  Skin:    General: Skin is warm and dry.     Capillary Refill: Capillary  refill takes less than 2 seconds.  Neurological:     General: No focal deficit present.     Mental Status: She is alert and oriented to person, place, and time.     Cranial Nerves: No cranial nerve deficit.     Sensory: No sensory deficit.     Motor: No weakness.  Psychiatric:        Mood and Affect: Mood normal.        Behavior: Behavior normal.  Thought Content: Thought content normal.        Judgment: Judgment normal.         Assessment And Plan:     Encounter for annual health examination Assessment & Plan: Behavior modifications discussed and diet history reviewed.   Pt will continue to exercise regularly and modify diet with low GI, plant based foods and decrease intake of processed foods.  Recommend intake of daily multivitamin, Vitamin D, and calcium.  Recommend mammogram and colonoscopy for preventive screenings, as well as recommend immunizations that include influenza, TDAP   Orders: -     CBC with Differential/Platelet  Influenza vaccination declined Assessment & Plan: Patient declined influenza vaccination at this time. Patient is aware that influenza vaccine prevents illness in 70% of healthy people, and reduces hospitalizations to 30-70% in elderly. This vaccine is recommended annually. Education has been provided regarding the importance of this vaccine but patient still declined. Advised may receive this vaccine at local pharmacy or Health Dept.or vaccine clinic. Aware to provide a copy of the vaccination record if obtained from local pharmacy or Health Dept.  Pt is willing to accept risk associated with refusing vaccination.    COVID-19 vaccination declined Assessment & Plan: Declines covid 19 vaccine. Discussed risk of covid 71 and if she changes her mind about the vaccine to call the office. Education has been provided regarding the importance of this vaccine but patient still declined. Advised may receive this vaccine at local pharmacy or Health Dept.or  vaccine clinic. Aware to provide a copy of the vaccination record if obtained from local pharmacy or Health Dept.  Encouraged to take multivitamin, vitamin d, vitamin c and zinc to increase immune system. Aware can call office if would like to have vaccine here at office. Verbalized acceptance and understanding.    Tetanus, diphtheria, and acellular pertussis (Tdap) vaccination declined  Screening mammogram, encounter for Assessment & Plan: Pt instructed on Self Breast Exam.According to ACOG guidelines Women aged 68 and older are recommended to get an annual mammogram. Order sent   Pt encouraged to get annual mammogram   Orders: -     Digital Screening Mammogram, Left and Right; Future  Colon cancer screening Assessment & Plan: According to USPTF Colorectal cancer Screening guidelines. Cologuard is recommended every 3 years, starting at age 49 years. Order for cologuard sent   Orders: -     Cologuard  History of hypothyroidism Assessment & Plan: Will check thyroid levels.   Orders: -     TSH + free T4  Anxiety and depression Assessment & Plan: Continue wellbutrin, tolerating well  Orders: -     CMP14+EGFR -     buPROPion HCl ER (XL); Take 2 tablets (300 mg total) by mouth daily.  Low iron Assessment & Plan: Previous history of low iron, will check levels today  Orders: -     Iron, TIBC and Ferritin Panel  Attention deficit hyperactivity disorder (ADHD), combined type Assessment & Plan: Continue f/u with behavioral health  Orders: -     Lisdexamfetamine Dimesylate; Take 1 capsule (20 mg total) by mouth daily.  Class 1 obesity due to excess calories without serious comorbidity with body mass index (BMI) of 34.0 to 34.9 in adult Assessment & Plan: She has lost approximately 5 lbs since her last visit. Currently on Zepbound will send refill but may need another PA. She had been without the medication for a couple of weeks.     Return for 1 year physical, 8-10  weeks weight check.  Patient was given opportunity to ask questions. Patient verbalized understanding of the plan and was able to repeat key elements of the plan. All questions were answered to their satisfaction.   Arnette Felts, FNP  I, Arnette Felts, FNP, have reviewed all documentation for this visit. The documentation on 11/05/22 for the exam, diagnosis, procedures, and orders are all accurate and complete.

## 2022-11-05 ENCOUNTER — Ambulatory Visit (INDEPENDENT_AMBULATORY_CARE_PROVIDER_SITE_OTHER): Payer: BC Managed Care – PPO | Admitting: Nurse Practitioner

## 2022-11-05 ENCOUNTER — Encounter: Payer: Self-pay | Admitting: Nurse Practitioner

## 2022-11-05 VITALS — BP 110/70 | HR 85 | Temp 98.7°F | Ht 61.0 in | Wt 182.2 lb

## 2022-11-05 DIAGNOSIS — Z1211 Encounter for screening for malignant neoplasm of colon: Secondary | ICD-10-CM

## 2022-11-05 DIAGNOSIS — Z8639 Personal history of other endocrine, nutritional and metabolic disease: Secondary | ICD-10-CM | POA: Diagnosis not present

## 2022-11-05 DIAGNOSIS — Z6834 Body mass index (BMI) 34.0-34.9, adult: Secondary | ICD-10-CM

## 2022-11-05 DIAGNOSIS — F32A Depression, unspecified: Secondary | ICD-10-CM

## 2022-11-05 DIAGNOSIS — E611 Iron deficiency: Secondary | ICD-10-CM

## 2022-11-05 DIAGNOSIS — F419 Anxiety disorder, unspecified: Secondary | ICD-10-CM | POA: Diagnosis not present

## 2022-11-05 DIAGNOSIS — F902 Attention-deficit hyperactivity disorder, combined type: Secondary | ICD-10-CM | POA: Diagnosis not present

## 2022-11-05 DIAGNOSIS — E6609 Other obesity due to excess calories: Secondary | ICD-10-CM

## 2022-11-05 DIAGNOSIS — Z1231 Encounter for screening mammogram for malignant neoplasm of breast: Secondary | ICD-10-CM

## 2022-11-05 DIAGNOSIS — E66811 Obesity, class 1: Secondary | ICD-10-CM

## 2022-11-05 DIAGNOSIS — Z2821 Immunization not carried out because of patient refusal: Secondary | ICD-10-CM

## 2022-11-05 DIAGNOSIS — Z0000 Encounter for general adult medical examination without abnormal findings: Secondary | ICD-10-CM

## 2022-11-05 MED ORDER — BUPROPION HCL ER (XL) 150 MG PO TB24
300.0000 mg | ORAL_TABLET | Freq: Every day | ORAL | Status: DC
Start: 2022-11-05 — End: 2023-08-26

## 2022-11-05 MED ORDER — LISDEXAMFETAMINE DIMESYLATE 20 MG PO CAPS
20.0000 mg | ORAL_CAPSULE | Freq: Every day | ORAL | Status: AC
Start: 2022-11-05 — End: ?

## 2022-11-06 ENCOUNTER — Encounter: Payer: Self-pay | Admitting: Nurse Practitioner

## 2022-11-06 LAB — CBC WITH DIFFERENTIAL/PLATELET
Basophils Absolute: 0 10*3/uL (ref 0.0–0.2)
Basos: 1 %
EOS (ABSOLUTE): 0.2 10*3/uL (ref 0.0–0.4)
Eos: 3 %
Hematocrit: 45.4 % (ref 34.0–46.6)
Hemoglobin: 14.9 g/dL (ref 11.1–15.9)
Immature Grans (Abs): 0 10*3/uL (ref 0.0–0.1)
Immature Granulocytes: 0 %
Lymphocytes Absolute: 2.2 10*3/uL (ref 0.7–3.1)
Lymphs: 35 %
MCH: 31.6 pg (ref 26.6–33.0)
MCHC: 32.8 g/dL (ref 31.5–35.7)
MCV: 96 fL (ref 79–97)
Monocytes Absolute: 0.5 10*3/uL (ref 0.1–0.9)
Monocytes: 9 %
Neutrophils Absolute: 3.3 10*3/uL (ref 1.4–7.0)
Neutrophils: 52 %
Platelets: 325 10*3/uL (ref 150–450)
RBC: 4.71 x10E6/uL (ref 3.77–5.28)
RDW: 12.8 % (ref 11.7–15.4)
WBC: 6.2 10*3/uL (ref 3.4–10.8)

## 2022-11-06 LAB — CMP14+EGFR
ALT: 19 [IU]/L (ref 0–32)
AST: 21 [IU]/L (ref 0–40)
Albumin: 4.4 g/dL (ref 3.9–4.9)
Alkaline Phosphatase: 60 [IU]/L (ref 44–121)
BUN/Creatinine Ratio: 11 (ref 9–23)
BUN: 11 mg/dL (ref 6–24)
Bilirubin Total: 0.4 mg/dL (ref 0.0–1.2)
CO2: 26 mmol/L (ref 20–29)
Calcium: 9.5 mg/dL (ref 8.7–10.2)
Chloride: 100 mmol/L (ref 96–106)
Creatinine, Ser: 1.03 mg/dL — ABNORMAL HIGH (ref 0.57–1.00)
Globulin, Total: 2.9 g/dL (ref 1.5–4.5)
Glucose: 80 mg/dL (ref 70–99)
Potassium: 4.6 mmol/L (ref 3.5–5.2)
Sodium: 139 mmol/L (ref 134–144)
Total Protein: 7.3 g/dL (ref 6.0–8.5)
eGFR: 67 mL/min/{1.73_m2} (ref >59)

## 2022-11-06 LAB — IRON,TIBC AND FERRITIN PANEL
Ferritin: 92 ng/mL (ref 15–150)
Iron Saturation: 43 % (ref 15–55)
Iron: 115 ug/dL (ref 27–159)
Total Iron Binding Capacity: 266 ug/dL (ref 250–450)
UIBC: 151 ug/dL (ref 131–425)

## 2022-11-06 LAB — TSH+FREE T4
Free T4: 1.34 ng/dL (ref 0.82–1.77)
TSH: 1.59 u[IU]/mL (ref 0.450–4.500)

## 2022-11-10 ENCOUNTER — Other Ambulatory Visit: Payer: Self-pay | Admitting: Nurse Practitioner

## 2022-11-10 MED ORDER — CYTOMEL 5 MCG PO TABS: 5.0000 ug | ORAL_TABLET | Freq: Every day | ORAL | 0 refills | Status: DC

## 2022-11-13 ENCOUNTER — Other Ambulatory Visit: Payer: Self-pay

## 2022-11-13 MED ORDER — CYTOMEL 5 MCG PO TABS: ORAL_TABLET | ORAL | 1 refills | Status: DC

## 2022-11-16 ENCOUNTER — Encounter: Payer: Self-pay | Admitting: Nurse Practitioner

## 2022-11-16 DIAGNOSIS — F902 Attention-deficit hyperactivity disorder, combined type: Secondary | ICD-10-CM | POA: Insufficient documentation

## 2022-11-16 DIAGNOSIS — E611 Iron deficiency: Secondary | ICD-10-CM | POA: Insufficient documentation

## 2022-11-16 DIAGNOSIS — Z2821 Immunization not carried out because of patient refusal: Secondary | ICD-10-CM | POA: Insufficient documentation

## 2022-11-16 DIAGNOSIS — Z1211 Encounter for screening for malignant neoplasm of colon: Secondary | ICD-10-CM | POA: Insufficient documentation

## 2022-11-16 DIAGNOSIS — Z1231 Encounter for screening mammogram for malignant neoplasm of breast: Secondary | ICD-10-CM | POA: Insufficient documentation

## 2022-11-16 NOTE — Assessment & Plan Note (Signed)
Pt instructed on Self Breast Exam.According to ACOG guidelines Women aged 48 and older are recommended to get an annual mammogram. Order sent   Pt encouraged to get annual mammogram

## 2022-11-16 NOTE — Assessment & Plan Note (Signed)
She has lost approximately 5 lbs since her last visit. Currently on Zepbound will send refill but may need another PA. She had been without the medication for a couple of weeks.

## 2022-11-16 NOTE — Assessment & Plan Note (Signed)
Continue wellbutrin, tolerating well

## 2022-11-16 NOTE — Assessment & Plan Note (Signed)
According to USPTF Colorectal cancer Screening guidelines. Cologuard is recommended every 3 years, starting at age 48 years. Order for cologuard sent

## 2022-11-16 NOTE — Assessment & Plan Note (Signed)

## 2022-11-16 NOTE — Assessment & Plan Note (Signed)
Will check thyroid levels

## 2022-11-16 NOTE — Assessment & Plan Note (Signed)
Continue f/u with behavioral health.

## 2022-11-16 NOTE — Assessment & Plan Note (Signed)
Previous history of low iron, will check levels today

## 2022-11-16 NOTE — Assessment & Plan Note (Signed)

## 2022-11-16 NOTE — Assessment & Plan Note (Signed)

## 2022-11-17 ENCOUNTER — Encounter: Payer: Self-pay | Admitting: Nurse Practitioner

## 2022-12-11 DIAGNOSIS — F902 Attention-deficit hyperactivity disorder, combined type: Secondary | ICD-10-CM | POA: Diagnosis not present

## 2022-12-11 DIAGNOSIS — Z79899 Other long term (current) drug therapy: Secondary | ICD-10-CM | POA: Diagnosis not present

## 2022-12-26 ENCOUNTER — Ambulatory Visit: Payer: BC Managed Care – PPO

## 2023-01-05 NOTE — Progress Notes (Signed)
Madelaine Bhat, CMA,acting as a Neurosurgeon for Arnette Felts, FNP.,have documented all relevant documentation on the behalf of Arnette Felts, FNP,as directed by  Arnette Felts, FNP while in the presence of Arnette Felts, FNP.  Subjective:  Patient ID: Rebekah Baker , female    DOB: 12-Mar-1975 , 47 y.o.   MRN: [id number]  Chief Complaint  Patient presents with   Obesity    HPI  Patient presents today for a weight follow up, Patient reports compliance with medication. Patient denies any chest pain, SOB, or headaches. Patient reports she is taking a wegovy compound that she got from an online provider.  When she switched from Zepbound to [facility] she gained weight. She is using her Peleton and walks 1-2 times a week for about 30 - 45 minutes. She is eating a healthy diet. She does not want the things she overeats typically. She does not feel she has food noise.       Past Medical History:  Diagnosis Date   Abnormal uterine bleeding (AUB) 01/03/2022   Arthritis 01/20/2021   Chest pain 08/05/2022   Depression      Family History  Problem Relation Age of Onset   Hypertension Mother    Hypertension Sister    Asthma Daughter    Asthma Son    ADD / ADHD Son      Current Outpatient Medications:    buPROPion (WELLBUTRIN XL) 150 MG 24 hr tablet, Take 2 tablets (300 mg total) by mouth daily., Disp: , Rfl:    CYTOMEL 5 MCG tablet, Take 2 tablets by mouth daily., Disp: 90 tablet, Rfl: 1   lisdexamfetamine (VYVANSE) 20 MG capsule, Take 1 capsule (20 mg total) by mouth daily., Disp: , Rfl:    tirzepatide (ZEPBOUND) 5 MG/0.5ML injection vial, Inject 5 mg into the skin once a week., Disp: 2 mL, Rfl: 1   No Known Allergies   Review of Systems  Constitutional: Negative.   Respiratory: Negative.  Negative for apnea.   Cardiovascular: Negative.   Musculoskeletal: Negative.   Neurological: Negative.   Psychiatric/Behavioral: Negative.       Today's Vitals   01/06/23 0831  BP: 120/80  Pulse: 78   Temp: 98.5 F (36.9 C)  TempSrc: Oral  Weight: 173 lb (78.5 kg)  Height: 5\' 1"  (1.549 m)  PainSc: 0-No pain   Body mass index is 32.69 kg/m.  Wt Readings from Last 3 Encounters:  01/06/23 173 lb (78.5 kg)  11/05/22 182 lb 3.2 oz (82.6 kg)  07/21/22 187 lb 6.4 oz (85 kg)     Objective:  Physical Exam Vitals reviewed.  Constitutional:      General: She is not in acute distress.    Appearance: Normal appearance. She is well-developed. She is obese.  HENT:     Head: Normocephalic and atraumatic.  Eyes:     Pupils: Pupils are equal, round, and reactive to light.  Cardiovascular:     Rate and Rhythm: Normal rate and regular rhythm.     Pulses: Normal pulses.     Heart sounds: Normal heart sounds. No murmur heard. Pulmonary:     Effort: Pulmonary effort is normal. No respiratory distress.     Breath sounds: Normal breath sounds. No wheezing.  Musculoskeletal:        General: No swelling or tenderness. Normal range of motion.  Skin:    General: Skin is warm and dry.     Capillary Refill: Capillary refill takes less than 2 seconds.  Neurological:  General: No focal deficit present.     Mental Status: She is alert and oriented to person, place, and time.     Cranial Nerves: No cranial nerve deficit.     Motor: No weakness.  Psychiatric:        Mood and Affect: Mood normal.        Assessment And Plan:  Hot flashes Assessment & Plan: She is interested in restarting her testosterone pellets, if this is causing her    Class 1 obesity due to excess calories without serious comorbidity with body mass index (BMI) of 32.0 to 32.9 in adult Assessment & Plan: She is interested in taking the [employer] pay, Rx sent to [facility] Return in 8-10 weeks for weight check  Orders: -     Zepbound; Inject 5 mg into the skin once a week.  Dispense: 2 mL; Refill: 1  COVID-19 vaccination declined Assessment & Plan: Declines covid 19 vaccine. Discussed risk of covid 87 and  if she changes her mind about the vaccine to call the office. Education has been provided regarding the importance of this vaccine but patient still declined. Advised may receive this vaccine at local pharmacy or Health Dept.or vaccine clinic. Aware to provide a copy of the vaccination record if obtained from local pharmacy or Health Dept.  Encouraged to take multivitamin, vitamin d, vitamin c and zinc to increase immune system. Aware can call office if would like to have vaccine here at office. Verbalized acceptance and understanding.     Return for  8-10 week weight check.  Patient was given opportunity to ask questions. Patient verbalized understanding of the plan and was able to repeat key elements of the plan. All questions were answered to their satisfaction.    Jeanell Sparrow, FNP, have reviewed all documentation for this visit. The documentation on 01/06/23 for the exam, diagnosis, procedures, and orders are all accurate and complete.   IF YOU HAVE BEEN REFERRED TO A SPECIALIST, IT MAY TAKE 1-2 WEEKS TO SCHEDULE/PROCESS THE REFERRAL. IF YOU HAVE NOT HEARD FROM US/SPECIALIST IN TWO WEEKS, PLEASE GIVE [place] A CALL AT [phone].

## 2023-01-06 ENCOUNTER — Encounter: Payer: Self-pay | Admitting: Nurse Practitioner

## 2023-01-06 ENCOUNTER — Ambulatory Visit (INDEPENDENT_AMBULATORY_CARE_PROVIDER_SITE_OTHER): Payer: BC Managed Care – PPO | Admitting: Nurse Practitioner

## 2023-01-06 VITALS — BP 120/80 | HR 78 | Temp 98.5°F | Ht 61.0 in | Wt 173.0 lb

## 2023-01-06 DIAGNOSIS — R232 Flushing: Secondary | ICD-10-CM | POA: Insufficient documentation

## 2023-01-06 DIAGNOSIS — Z2821 Immunization not carried out because of patient refusal: Secondary | ICD-10-CM

## 2023-01-06 DIAGNOSIS — E6609 Other obesity due to excess calories: Secondary | ICD-10-CM | POA: Diagnosis not present

## 2023-01-06 DIAGNOSIS — Z6832 Body mass index (BMI) 32.0-32.9, adult: Secondary | ICD-10-CM

## 2023-01-06 DIAGNOSIS — E66811 Obesity, class 1: Secondary | ICD-10-CM

## 2023-01-06 MED ORDER — ZEPBOUND 5 MG/0.5ML ~~LOC~~ SOLN: 5.0000 mg | SUBCUTANEOUS | 1 refills | Status: DC

## 2023-01-06 NOTE — Assessment & Plan Note (Addendum)
She is interested in taking the [employer] pay, Rx sent to [facility] Return in 8-10 weeks for weight check

## 2023-01-06 NOTE — Assessment & Plan Note (Signed)
She is interested in restarting her testosterone pellets, if this is causing her

## 2023-01-06 NOTE — Assessment & Plan Note (Signed)

## 2023-01-28 ENCOUNTER — Ambulatory Visit: Payer: BC Managed Care – PPO

## 2023-02-13 ENCOUNTER — Ambulatory Visit: Payer: BC Managed Care – PPO

## 2023-02-13 ENCOUNTER — Ambulatory Visit
Admission: RE | Admit: 2023-02-13 | Discharge: 2023-02-13 | Disposition: A | Discharge status: Home / Self Care | Payer: BC Managed Care – PPO | Source: Ambulatory Visit | Attending: Nurse Practitioner | Admitting: Nurse Practitioner

## 2023-02-13 DIAGNOSIS — Z1231 Encounter for screening mammogram for malignant neoplasm of breast: Secondary | ICD-10-CM | POA: Diagnosis not present

## 2023-02-25 ENCOUNTER — Other Ambulatory Visit: Payer: Self-pay | Admitting: Nurse Practitioner

## 2023-02-27 DIAGNOSIS — N946 Dysmenorrhea, unspecified: Secondary | ICD-10-CM | POA: Diagnosis not present

## 2023-02-27 DIAGNOSIS — N939 Abnormal uterine and vaginal bleeding, unspecified: Secondary | ICD-10-CM | POA: Diagnosis not present

## 2023-02-27 DIAGNOSIS — M7918 Myalgia, other site: Secondary | ICD-10-CM | POA: Diagnosis not present

## 2023-03-16 DIAGNOSIS — N951 Menopausal and female climacteric states: Secondary | ICD-10-CM | POA: Diagnosis not present

## 2023-03-16 DIAGNOSIS — E559 Vitamin D deficiency, unspecified: Secondary | ICD-10-CM | POA: Diagnosis not present

## 2023-03-16 DIAGNOSIS — R5382 Chronic fatigue, unspecified: Secondary | ICD-10-CM | POA: Diagnosis not present

## 2023-03-17 NOTE — Progress Notes (Deleted)
 Rebekah Baker, CMA,acting as a Neurosurgeon for Rebekah Felts, FNP.,have documented all relevant documentation on the behalf of Rebekah Felts, FNP,as directed by  Rebekah Felts, FNP while in the presence of Rebekah Felts, FNP.  Subjective:  Patient ID: Rebekah Baker , female    DOB: 02/12/1975 , 48 y.o.   MRN: [id number]  No chief complaint on file.   HPI  Patient presents today for a weight follow up, Patient reports compliance with medication. Patient denies any chest pain, SOB, or headaches. Patient has no concerns today.     Past Medical History:  Diagnosis Date  . Abnormal uterine bleeding (AUB) 01/03/2022  . Arthritis 01/20/2021  . Chest pain 08/05/2022  . Depression      Family History  Problem Relation Age of Onset  . Hypertension Mother   . Hypertension Sister   . Asthma Daughter   . Asthma Son   . ADD / ADHD Son      Current Outpatient Medications:  .  buPROPion (WELLBUTRIN XL) 150 MG 24 hr tablet, Take 2 tablets (300 mg total) by mouth daily., Disp: , Rfl:  .  liothyronine (CYTOMEL) 5 MCG tablet, Take 2 tablets by mouth once daily, Disp: 90 tablet, Rfl: 0 .  lisdexamfetamine (VYVANSE) 20 MG capsule, Take 1 capsule (20 mg total) by mouth daily., Disp: , Rfl:  .  tirzepatide (ZEPBOUND) 5 MG/0.5ML injection vial, Inject 5 mg into the skin once a week., Disp: 2 mL, Rfl: 1   No Known Allergies   Review of Systems   There were no vitals filed for this visit. There is no height or weight on file to calculate BMI.  Wt Readings from Last 3 Encounters:  01/06/23 173 lb (78.5 kg)  11/05/22 182 lb 3.2 oz (82.6 kg)  07/21/22 187 lb 6.4 oz (85 kg)    The 10-year ASCVD risk score (Arnett DK, et al., 2019) is: 0.9%   Values used to calculate the score:     Age: 78 years     Sex: Female     Is Non-Hispanic African American: Yes     Diabetic: No     Tobacco smoker: No     Systolic Blood Pressure: 118 mmHg     Is BP treated: No     HDL Cholesterol: 59 mg/dL     Total  Cholesterol: 176 mg/dL  Objective:  Physical Exam      Assessment And Plan:  There are no diagnoses linked to this encounter.  No follow-ups on file.  Patient was given opportunity to ask questions. Patient verbalized understanding of the plan and was able to repeat key elements of the plan. All questions were answered to their satisfaction.    Rebekah Sparrow, FNP, have reviewed all documentation for this visit. The documentation on 03/17/23 for the exam, diagnosis, procedures, and orders are all accurate and complete.   IF YOU HAVE BEEN REFERRED TO A SPECIALIST, IT MAY TAKE 1-2 WEEKS TO SCHEDULE/PROCESS THE REFERRAL. IF YOU HAVE NOT HEARD FROM US/SPECIALIST IN TWO WEEKS, PLEASE GIVE [place] A CALL AT [phone].

## 2023-03-18 ENCOUNTER — Ambulatory Visit: Payer: BC Managed Care – PPO | Admitting: Nurse Practitioner

## 2023-03-25 DIAGNOSIS — M2550 Pain in unspecified joint: Secondary | ICD-10-CM | POA: Diagnosis not present

## 2023-03-25 DIAGNOSIS — N951 Menopausal and female climacteric states: Secondary | ICD-10-CM | POA: Diagnosis not present

## 2023-03-25 DIAGNOSIS — R5382 Chronic fatigue, unspecified: Secondary | ICD-10-CM | POA: Diagnosis not present

## 2023-03-25 DIAGNOSIS — E559 Vitamin D deficiency, unspecified: Secondary | ICD-10-CM | POA: Diagnosis not present

## 2023-04-07 DIAGNOSIS — Z79899 Other long term (current) drug therapy: Secondary | ICD-10-CM | POA: Diagnosis not present

## 2023-04-07 DIAGNOSIS — F902 Attention-deficit hyperactivity disorder, combined type: Secondary | ICD-10-CM | POA: Diagnosis not present

## 2023-04-11 ENCOUNTER — Other Ambulatory Visit: Payer: Self-pay | Admitting: Nurse Practitioner

## 2023-05-15 ENCOUNTER — Other Ambulatory Visit: Payer: Self-pay | Admitting: Nurse Practitioner

## 2023-06-03 DIAGNOSIS — E559 Vitamin D deficiency, unspecified: Secondary | ICD-10-CM | POA: Diagnosis not present

## 2023-06-03 DIAGNOSIS — E039 Hypothyroidism, unspecified: Secondary | ICD-10-CM | POA: Diagnosis not present

## 2023-06-03 DIAGNOSIS — N951 Menopausal and female climacteric states: Secondary | ICD-10-CM | POA: Diagnosis not present

## 2023-06-08 DIAGNOSIS — N951 Menopausal and female climacteric states: Secondary | ICD-10-CM | POA: Diagnosis not present

## 2023-06-08 DIAGNOSIS — R5382 Chronic fatigue, unspecified: Secondary | ICD-10-CM | POA: Diagnosis not present

## 2023-06-08 DIAGNOSIS — M2550 Pain in unspecified joint: Secondary | ICD-10-CM | POA: Diagnosis not present

## 2023-06-08 DIAGNOSIS — E039 Hypothyroidism, unspecified: Secondary | ICD-10-CM | POA: Diagnosis not present

## 2023-06-26 ENCOUNTER — Encounter: Payer: Self-pay | Admitting: Nurse Practitioner

## 2023-07-06 ENCOUNTER — Telehealth: Payer: Self-pay

## 2023-07-06 ENCOUNTER — Ambulatory Visit: Admitting: Nurse Practitioner

## 2023-07-06 ENCOUNTER — Encounter: Payer: Self-pay | Admitting: Nurse Practitioner

## 2023-07-06 VITALS — BP 120/70 | HR 84 | Temp 98.5°F | Ht 61.0 in | Wt 181.4 lb

## 2023-07-06 DIAGNOSIS — E6609 Other obesity due to excess calories: Secondary | ICD-10-CM

## 2023-07-06 DIAGNOSIS — E66811 Obesity, class 1: Secondary | ICD-10-CM | POA: Diagnosis not present

## 2023-07-06 DIAGNOSIS — Z6834 Body mass index (BMI) 34.0-34.9, adult: Secondary | ICD-10-CM | POA: Diagnosis not present

## 2023-07-06 DIAGNOSIS — Z2821 Immunization not carried out because of patient refusal: Secondary | ICD-10-CM

## 2023-07-06 MED ORDER — ZEPBOUND 5 MG/0.5ML ~~LOC~~ SOAJ
5.0000 mg | SUBCUTANEOUS | 1 refills | Status: DC
Start: 2023-07-06 — End: 2023-08-26

## 2023-07-06 MED ORDER — ZEPBOUND 5 MG/0.5ML ~~LOC~~ SOLN: 5.0000 mg | SUBCUTANEOUS | 1 refills | Status: DC

## 2023-07-06 NOTE — Assessment & Plan Note (Signed)

## 2023-07-06 NOTE — Assessment & Plan Note (Signed)
 ASK- Given permission to discuss weight today ASSESS - Body mass index is 34.28 kg/m., well tolerated, WAIST CIRCUMFERENCE 39cm, her weight has increased by 8 lbs since December in spite of exercising approximately 4 days a week.  ADVISE  - on risk of cardiovascular disease due to obesity and increased waist circumference. Also discussed her knee pain with her weight gain per patient. AGREE - to increase her physical activity with resistance band strength training. Also agrees to start on Zepbound  pending approval from her insurance. She is aware of risks of constipation and nausea, has taken in the past.  ASSIST - ordered Zepbound  pending insurance approval, feels barriers related to knee pain.

## 2023-07-06 NOTE — Telephone Encounter (Signed)
 Request Reference Number: [id number]. ZEPBOUND  INJ 5/0.5ML is approved through 01/05/2024. Your patient may now fill this prescription and it will be covered.. Authorization Expiration Date: January 05, 2024.

## 2023-07-06 NOTE — Telephone Encounter (Signed)
PA for zepbound sent to plan

## 2023-07-06 NOTE — Progress Notes (Signed)
 [place], CMA,acting as a Neurosurgeon for Susanna Epley, FNP.,have documented all relevant documentation on the behalf of Susanna Epley, FNP,as directed by  Susanna Epley, FNP while in the presence of Susanna Epley, FNP.  Subjective:  Patient ID: Rebekah Baker , female    DOB: [birth year] , 48 y.o.   MRN: [id number]  Chief Complaint  Patient presents with   Obesity    Patient presents today for weight loss medication, patient would like to start zepbound  she states she does need a PA first.  She reports she was supposed to get the zepbound  self pay but couldn't afford it she was now told her insurance does cover it after a PA is completed.      HPI  Here for weight management. She was to start the cash pay Zepbound  but is now unable to afford. She would like to try with her insurance again.   Starting weight: 181 Starting date: 07/06/23 Today's weight: 181 lbs Today's date: 07/06/2023  Total lbs lost to date: 0 lbs Total lbs lost since last in-office visit: +8 lbs  Wt Readings from Last 3 Encounters: 07/06/23 : 181 lb 6.4 oz (82.3 kg) 01/06/23 : 173 lb (78.5 kg) 11/05/22 : 182 lb 3.2 oz (82.6 kg)  Exercising: 4 days a week walks in am for 1/2 hour 45 minutes. She also uses a stationary bike. She is not doing any resistance bands. She was doing 15 lb weights with squats. She has not done any HIIT training in the past.  Water intake: she struggles with drinking water. Goal is to drink at least 64 oz but may do half the amount.  Diet: high protein, she tries to avoid carbs and eats vegetables. She will have larger portions than she needs to. She will scoop out the center of the bagel. Dinner is usually a protein and vegetables. Salmon and broccoli and a little bit of rice at dinner. She does not intermittent fast on a regular basis.   Weight at home is around 178-179 lbs. She feels like her knee pain is coming from her weight increase. Her knee does not hurt as bad when her weight is 150  lbs.        Past Medical History:  Diagnosis Date   Abnormal uterine bleeding (AUB) 01/03/2022   Arthritis 01/20/2021   Chest pain 08/05/2022   Depression      Family History  Problem Relation Age of Onset   Hypertension Mother    Hypertension Sister    Asthma Daughter    Asthma Son    ADD / ADHD Son      Current Outpatient Medications:    buPROPion  (WELLBUTRIN  XL) 150 MG 24 hr tablet, Take 2 tablets (300 mg total) by mouth daily., Disp: , Rfl:    liothyronine (CYTOMEL ) 5 MCG tablet, Take 2 tablets by mouth once daily, Disp: 90 tablet, Rfl: 0   lisdexamfetamine (VYVANSE ) 20 MG capsule, Take 1 capsule (20 mg total) by mouth daily., Disp: , Rfl:    tirzepatide  (ZEPBOUND ) 5 MG/0.5ML injection vial, Inject 5 mg into the skin once a week., Disp: 2 mL, Rfl: 1   No Known Allergies   Review of Systems  Constitutional: Negative.   Respiratory: Negative.  Negative for apnea.   Cardiovascular: Negative.   Gastrointestinal: Negative.   Musculoskeletal: Negative.   Neurological: Negative.   Psychiatric/Behavioral: Negative.       Today's Vitals   07/06/23 1135  BP: 120/70  Pulse: 84  Temp:  98.5 F (36.9 C)  TempSrc: Oral  Weight: 181 lb 6.4 oz (82.3 kg)  Height: 5' 1 (1.549 m)  PainSc: 0-No pain   Body mass index is 34.28 kg/m.  Wt Readings from Last 3 Encounters:  07/06/23 181 lb 6.4 oz (82.3 kg)  01/06/23 173 lb (78.5 kg)  11/05/22 182 lb 3.2 oz (82.6 kg)    Objective:  Physical Exam Vitals and nursing note reviewed.  Constitutional:      General: She is not in acute distress.    Appearance: Normal appearance. She is well-developed. She is obese.  HENT:     Head: Normocephalic and atraumatic.   Eyes:     Pupils: Pupils are equal, round, and reactive to light.    Cardiovascular:     Rate and Rhythm: Normal rate and regular rhythm.     Pulses: Normal pulses.     Heart sounds: Normal heart sounds. No murmur heard. Pulmonary:     Effort: Pulmonary effort  is normal. No respiratory distress.     Breath sounds: Normal breath sounds. No wheezing.  Abdominal:     Comments: Central obesity   Musculoskeletal:        General: No swelling or tenderness. Normal range of motion.   Skin:    General: Skin is warm and dry.     Capillary Refill: Capillary refill takes less than 2 seconds.   Neurological:     General: No focal deficit present.     Mental Status: She is alert and oriented to person, place, and time.     Cranial Nerves: No cranial nerve deficit.     Motor: No weakness.   Psychiatric:        Mood and Affect: Mood normal.      Assessment And Plan:  Class 1 obesity due to excess calories without serious comorbidity with body mass index (BMI) of 34.0 to 34.9 in adult Assessment & Plan: ASK- Given permission to discuss weight today ASSESS - Body mass index is 34.28 kg/m., well tolerated, WAIST CIRCUMFERENCE 39cm, her weight has increased by 8 lbs since December in spite of exercising approximately 4 days a week.  ADVISE  - on risk of cardiovascular disease due to obesity and increased waist circumference. Also discussed her knee pain with her weight gain per patient. AGREE - to increase her physical activity with resistance band strength training. Also agrees to start on Zepbound  pending approval from her insurance. She is aware of risks of constipation and nausea, has taken in the past.  ASSIST - ordered Zepbound  pending insurance approval, feels barriers related to knee pain.    Orders: -     Zepbound ; Inject 5 mg into the skin once a week.  Dispense: 2 mL; Refill: 1  COVID-19 vaccination declined Assessment & Plan: Declines covid 19 vaccine. Discussed risk of covid 1 and if she changes her mind about the vaccine to call the office. Education has been provided regarding the importance of this vaccine but patient still declined. Advised may receive this vaccine at local pharmacy or Health Dept.or vaccine clinic. Aware to provide a  copy of the vaccination record if obtained from local pharmacy or Health Dept.  Encouraged to take multivitamin, vitamin d, vitamin c and zinc  to increase immune system. Aware can call office if would like to have vaccine here at office. Verbalized acceptance and understanding.     Return for 2 months weight check.  Patient was given opportunity to ask questions. Patient verbalized understanding  of the plan and was able to repeat key elements of the plan. All questions were answered to their satisfaction.    Inge Mangle, FNP, have reviewed all documentation for this visit. The documentation on 07/06/23 for the exam, diagnosis, procedures, and orders are all accurate and complete.   IF YOU HAVE BEEN REFERRED TO A SPECIALIST, IT MAY TAKE 1-2 WEEKS TO SCHEDULE/PROCESS THE REFERRAL. IF YOU HAVE NOT HEARD FROM US /SPECIALIST IN TWO WEEKS, PLEASE GIVE US  A CALL AT [phone].

## 2023-07-15 ENCOUNTER — Ambulatory Visit: Admitting: Nurse Practitioner

## 2023-07-21 ENCOUNTER — Ambulatory Visit: Admitting: Nurse Practitioner

## 2023-07-28 DIAGNOSIS — N951 Menopausal and female climacteric states: Secondary | ICD-10-CM | POA: Diagnosis not present

## 2023-07-28 DIAGNOSIS — E039 Hypothyroidism, unspecified: Secondary | ICD-10-CM | POA: Diagnosis not present

## 2023-07-30 DIAGNOSIS — N951 Menopausal and female climacteric states: Secondary | ICD-10-CM | POA: Diagnosis not present

## 2023-08-07 DIAGNOSIS — Z79899 Other long term (current) drug therapy: Secondary | ICD-10-CM | POA: Diagnosis not present

## 2023-08-07 DIAGNOSIS — F902 Attention-deficit hyperactivity disorder, combined type: Secondary | ICD-10-CM | POA: Diagnosis not present

## 2023-08-26 ENCOUNTER — Ambulatory Visit: Admitting: Nurse Practitioner

## 2023-08-26 VITALS — BP 120/70 | HR 85 | Temp 98.8°F | Ht 61.0 in | Wt 174.0 lb

## 2023-08-26 DIAGNOSIS — Z6832 Body mass index (BMI) 32.0-32.9, adult: Secondary | ICD-10-CM | POA: Diagnosis not present

## 2023-08-26 DIAGNOSIS — E66811 Obesity, class 1: Secondary | ICD-10-CM | POA: Insufficient documentation

## 2023-08-26 DIAGNOSIS — E6609 Other obesity due to excess calories: Secondary | ICD-10-CM

## 2023-08-26 DIAGNOSIS — N898 Other specified noninflammatory disorders of vagina: Secondary | ICD-10-CM | POA: Diagnosis not present

## 2023-08-26 MED ORDER — ZEPBOUND 7.5 MG/0.5ML ~~LOC~~ SOAJ: 7.5000 mg | SUBCUTANEOUS | 1 refills | Status: DC

## 2023-08-26 MED ORDER — CLOTRIMAZOLE-BETAMETHASONE 1-0.05 % EX CREA: 1.0000 | TOPICAL_CREAM | Freq: Two times a day (BID) | CUTANEOUS | 1 refills | Status: AC

## 2023-08-26 NOTE — Patient Instructions (Signed)
 Congratulations on your weight loss. Continue focusing on healthy diet and regular exercise.

## 2023-08-26 NOTE — Assessment & Plan Note (Addendum)
 Weight loss plateau despite initial success. Current weight 174 lbs, down from 181 lbs in June. Exercise includes biking; walking causes knee pain. - Increase Tirzepatide  (Zepbound ) to 7.5 mg once weekly after current supply is finished. She is approved until January 05, 2024 - Continue biking, avoid walking to prevent knee pain. - Follow up in two months to reassess weight management plan.

## 2023-08-26 NOTE — Progress Notes (Signed)
 [name], CMA,acting as a Neurosurgeon for Gaines Ada, FNP.,have documented all relevant documentation on the behalf of Gaines Ada, FNP,as directed by  Gaines Ada, FNP while in the presence of Gaines Ada, FNP.  Subjective:  Patient ID: Rebekah Baker , female    DOB: Jun 02, 1975 , 48 y.o.   MRN: [id number]  Chief Complaint  Patient presents with   Obesity    Patient presents today for a WEIGHT follow up, Patient reports compliance with medication. Patient denies any chest pain, SOB, or headaches. Patient has no concerns today.      HPI  Discussed the use of AI scribe software for clinical note transcription with the patient, who gave verbal consent to proceed.  History of Present Illness Rebekah Baker is a 48 year old female who presents for weight management and vaginal irritation.  She has been using Zepbound  for weight management. Initially, she experienced significant weight loss in the first two weeks, but the last two weeks have shown a plateau, with a loss of only half a pound to a pound. She encountered an issue with one of the doses and contacted [facility] for a replacement, which was provided. She is currently on 5 mg of Zepbound . She obtained her medication through a pharmacy with a voucher, reducing her copay from $35 to $15 with a coupon. She exercises regularly, primarily using a bike due to knee pain that worsens with walking. She aims to walk two miles three to four times a week without discomfort.  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 She uses it two to three times a week and did not take it much over the summer. This medication is provided through [facility]  She experiences vaginal irritation, describing it as feeling like 'dry skin on the outside' with itching. This has been occurring almost daily recently, whereas it was previously intermittent. She changes clothes  immediately after working out and has tried switching liners without relief. Her menstrual cycle is still present but has changed, with the last cycle being about 45 days. She has not yet gone through full menopause.   Past Medical History:  Diagnosis Date   Abnormal uterine bleeding (AUB) 01/03/2022   Arthritis 01/20/2021   Chest pain 08/05/2022   Depression      Family History  Problem Relation Age of Onset   Hypertension Mother    Hypertension Sister    Asthma Daughter    Asthma Son    ADD / ADHD Son      Current Outpatient Medications:    clotrimazole -betamethasone  (LOTRISONE ) cream, Apply 1 Application topically 2 (two) times daily., Disp: 45 g, Rfl: 1   liothyronine (CYTOMEL ) 5 MCG tablet, Take 2 tablets by mouth once daily, Disp: 90 tablet, Rfl: 0   lisdexamfetamine (VYVANSE ) 20 MG capsule, Take 1 capsule (20 mg total) by mouth daily., Disp: , Rfl:    tirzepatide  (ZEPBOUND ) 7.5 MG/0.5ML Pen, Inject 7.5 mg into the skin once a week., Disp: 2 mL, Rfl: 1   No Known Allergies   Review of Systems  Constitutional: Negative.   Respiratory: Negative.  Negative for apnea.   Cardiovascular: Negative.   Gastrointestinal: Negative.   Musculoskeletal: Negative.   Neurological: Negative.   Psychiatric/Behavioral: Negative.       Today's Vitals   08/26/23 1054  BP: 120/70  Pulse: 85  Temp: 98.8 F (37.1 C)  TempSrc: Oral  Weight: 174 lb (78.9 kg)  Height: 5' 1 (1.549 m)  PainSc: 0-No pain   Body mass index is 32.88 kg/m.  Wt Readings from Last 3 Encounters:  08/26/23 174 lb (78.9 kg)  07/06/23 181 lb 6.4 oz (82.3 kg)  01/06/23 173 lb (78.5 kg)      Objective:  Physical Exam Vitals and nursing note reviewed.  Constitutional:      General: She is not in acute distress.    Appearance: Normal appearance. She is well-developed. She is obese.  HENT:     Head: Normocephalic and atraumatic.  Eyes:     Pupils: Pupils are equal, round, and reactive to light.   Cardiovascular:     Rate and Rhythm: Normal rate and regular rhythm.     Pulses: Normal pulses.     Heart sounds: Normal heart sounds. No murmur heard. Pulmonary:     Effort: Pulmonary effort is normal. No respiratory distress.     Breath sounds: Normal breath sounds. No wheezing.  Abdominal:     Comments: Central obesity  Genitourinary:    Comments: Erythema present to groin areas where the underwear lining is located. Musculoskeletal:        General: No swelling or tenderness. Normal range of motion.  Skin:    General: Skin is warm and dry.     Capillary Refill: Capillary refill takes less than 2 seconds.  Neurological:     General: No focal deficit present.     Mental Status: She is alert and oriented to person, place, and time.     Cranial Nerves: No cranial nerve deficit.     Motor: No weakness.  Psychiatric:        Mood and Affect: Mood normal.      Assessment And Plan:  Class 1 obesity due to excess calories without serious comorbidity with body mass index (BMI) of 32.0 to 32.9 in adult Assessment & Plan: Weight loss plateau despite initial success. Current weight 174 lbs, down from 181 lbs in June. Exercise includes biking; walking causes knee pain. - Increase Tirzepatide  (Zepbound ) to 7.5 mg once weekly after current supply is finished. She is approved until January 05, 2024 - Continue biking, avoid walking to prevent knee pain. - Follow up in two months to reassess weight management plan.  Orders: -     Zepbound ; Inject 7.5 mg into the skin once a week.  Dispense: 2 mL; Refill: 1  Vaginal irritation Assessment & Plan: Intermittent vulvar irritation with pruritus, likely due to dry skin. Differential includes yeast infection or lichen planus. No vaginal dryness noted, slight redness noted. - Prescribe combination cream with antifungal and steroid, apply twice daily. - Advise wearing cotton underwear and changing out of workout clothes immediately after  exercise. - Recommend airing out the area at night. - Reassess if symptoms do not improve with treatment.  Orders: -     Clotrimazole -Betamethasone ; Apply 1 Application topically 2 (two) times daily.  Dispense: 45 g; Refill: 1    Return for KEEP SAME NEXT can do virtual if not physical.  Patient was given opportunity to ask questions. Patient verbalized understanding of the plan and was able to repeat key elements of the plan. All questions were answered to their satisfaction.    LILLETTE Gaines Ada, FNP, have reviewed all documentation for this visit. The documentation on 08/26/23 for the exam, diagnosis, procedures, and orders are all accurate and complete.   IF YOU HAVE BEEN REFERRED TO A SPECIALIST, IT MAY TAKE 1-2 WEEKS TO SCHEDULE/PROCESS THE  REFERRAL. IF YOU HAVE NOT HEARD FROM US /SPECIALIST IN TWO WEEKS, PLEASE GIVE US  A CALL AT [phone].

## 2023-08-26 NOTE — Assessment & Plan Note (Signed)
 Intermittent vulvar irritation with pruritus, likely due to dry skin. Differential includes yeast infection or lichen planus. No vaginal dryness noted, slight redness noted. - Prescribe combination cream with antifungal and steroid, apply twice daily. - Advise wearing cotton underwear and changing out of workout clothes immediately after exercise. - Recommend airing out the area at night. - Reassess if symptoms do not improve with treatment.

## 2023-09-04 DIAGNOSIS — N951 Menopausal and female climacteric states: Secondary | ICD-10-CM | POA: Diagnosis not present

## 2023-09-08 ENCOUNTER — Ambulatory Visit: Admitting: Nurse Practitioner

## 2023-09-13 DIAGNOSIS — M79601 Pain in right arm: Secondary | ICD-10-CM | POA: Diagnosis not present

## 2023-09-15 DIAGNOSIS — R5382 Chronic fatigue, unspecified: Secondary | ICD-10-CM | POA: Diagnosis not present

## 2023-09-15 DIAGNOSIS — N951 Menopausal and female climacteric states: Secondary | ICD-10-CM | POA: Diagnosis not present

## 2023-09-15 DIAGNOSIS — M2550 Pain in unspecified joint: Secondary | ICD-10-CM | POA: Diagnosis not present

## 2023-09-15 DIAGNOSIS — E039 Hypothyroidism, unspecified: Secondary | ICD-10-CM | POA: Diagnosis not present

## 2023-09-28 DIAGNOSIS — F902 Attention-deficit hyperactivity disorder, combined type: Secondary | ICD-10-CM | POA: Diagnosis not present

## 2023-09-28 DIAGNOSIS — F4011 Social phobia, generalized: Secondary | ICD-10-CM | POA: Diagnosis not present

## 2023-10-02 DIAGNOSIS — M25511 Pain in right shoulder: Secondary | ICD-10-CM | POA: Diagnosis not present

## 2023-10-02 DIAGNOSIS — M5010 Cervical disc disorder with radiculopathy, unspecified cervical region: Secondary | ICD-10-CM | POA: Diagnosis not present

## 2023-10-12 DIAGNOSIS — F4011 Social phobia, generalized: Secondary | ICD-10-CM | POA: Diagnosis not present

## 2023-10-13 IMAGING — DX DG CHEST 2V
2 series · 2 of 2 positions shown · non-contrast
Comparison: None.

CLINICAL DATA: Persistent cough.  Rule out pneumonia.

EXAM:
CHEST - 2 VIEW

[chest pa]
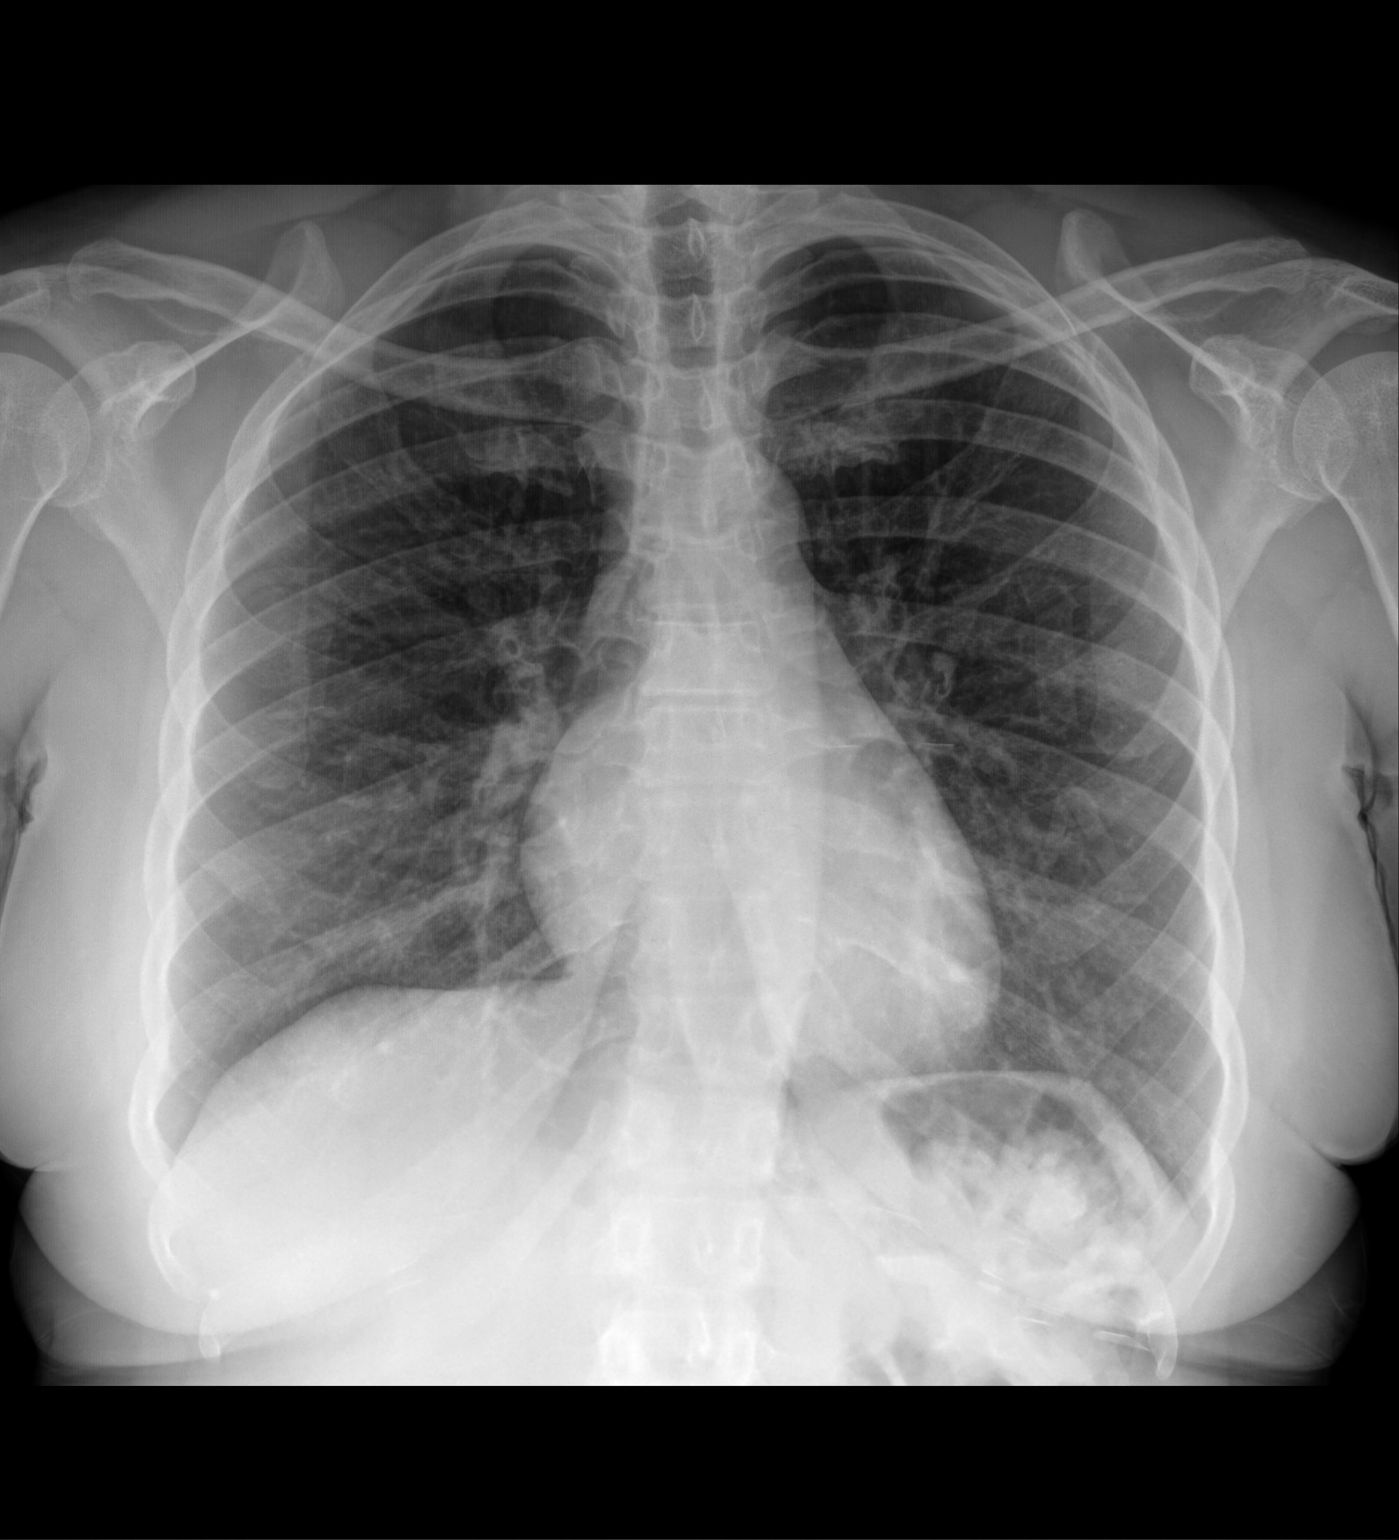

[chest lat]
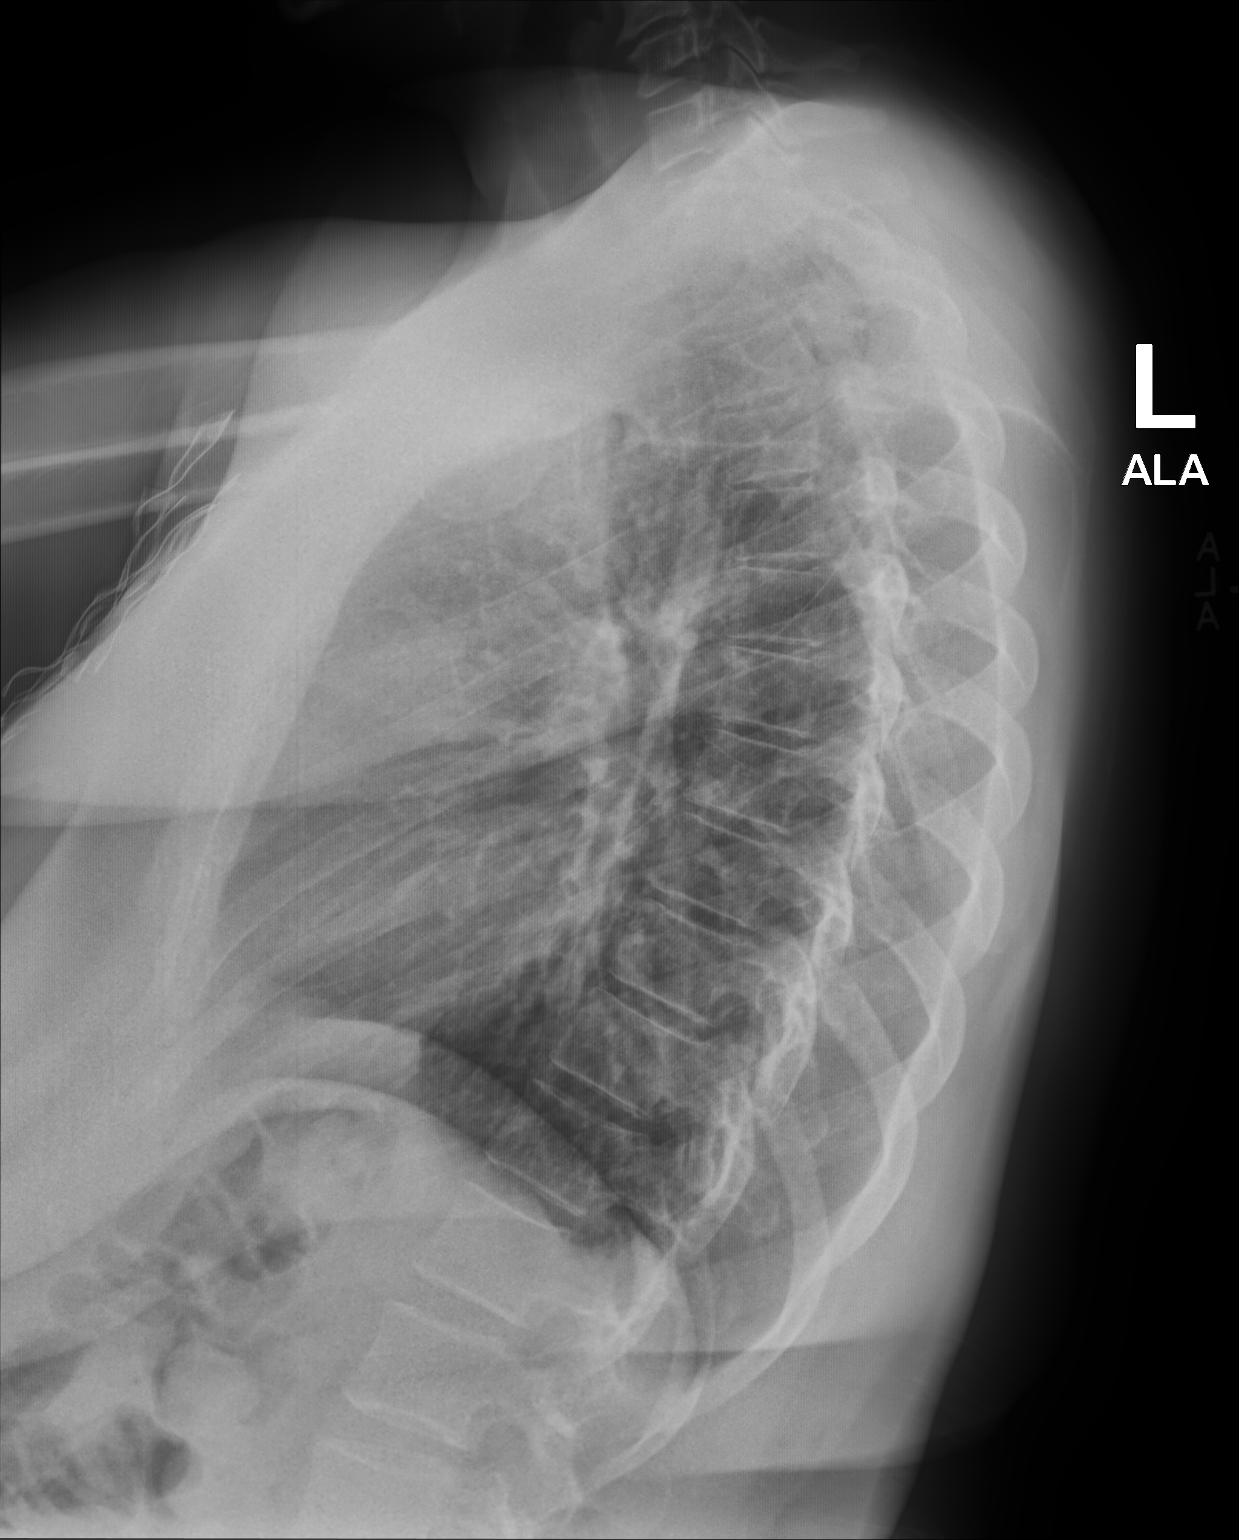

[2 of 2 positions shown; findings below may reference images not displayed]

FINDINGS: Lateral view degraded by patient arm position.

Midline trachea.  Normal heart size and mediastinal contours.

Sharp costophrenic angles.  No pneumothorax.  Clear lungs.
IMPRESSION: No active cardiopulmonary disease.

## 2023-10-26 DIAGNOSIS — F902 Attention-deficit hyperactivity disorder, combined type: Secondary | ICD-10-CM | POA: Diagnosis not present

## 2023-10-26 DIAGNOSIS — F4011 Social phobia, generalized: Secondary | ICD-10-CM | POA: Diagnosis not present

## 2023-11-09 ENCOUNTER — Encounter: Payer: BC Managed Care – PPO | Admitting: Nurse Practitioner

## 2023-11-09 DIAGNOSIS — F902 Attention-deficit hyperactivity disorder, combined type: Secondary | ICD-10-CM | POA: Diagnosis not present

## 2023-11-09 DIAGNOSIS — F4011 Social phobia, generalized: Secondary | ICD-10-CM | POA: Diagnosis not present

## 2023-11-09 DIAGNOSIS — Z79899 Other long term (current) drug therapy: Secondary | ICD-10-CM | POA: Diagnosis not present

## 2023-11-11 ENCOUNTER — Encounter: Payer: BC Managed Care – PPO | Admitting: Nurse Practitioner

## 2023-11-16 ENCOUNTER — Ambulatory Visit: Payer: Self-pay

## 2023-11-16 ENCOUNTER — Ambulatory Visit: Admitting: Internal Medicine

## 2023-11-16 VITALS — BP 140/94 | HR 94 | Temp 98.3°F | Ht 61.0 in | Wt 170.0 lb

## 2023-11-16 DIAGNOSIS — H8110 Benign paroxysmal vertigo, unspecified ear: Secondary | ICD-10-CM

## 2023-11-16 DIAGNOSIS — F902 Attention-deficit hyperactivity disorder, combined type: Secondary | ICD-10-CM | POA: Diagnosis not present

## 2023-11-16 DIAGNOSIS — F4011 Social phobia, generalized: Secondary | ICD-10-CM | POA: Diagnosis not present

## 2023-11-16 MED ORDER — MECLIZINE HCL 25 MG PO TABS: 25.0000 mg | ORAL_TABLET | Freq: Three times a day (TID) | ORAL | 0 refills | Status: AC | PRN

## 2023-11-16 NOTE — Telephone Encounter (Signed)
 This RN attempted to reach pt to triage. VM left to call back   Message from [facility] sent at 11/16/2023 10:06 AM EDT  Summary: dizziness concern   Reason for Triage: The patient would like to speak with a member of clinical staff about dizziness that they've been experiencing for a few weeks. The patient shares that this past weekend they experienced a significant increase in the dizziness and it's intensity.

## 2023-11-16 NOTE — Telephone Encounter (Signed)
  Reason for Disposition  [1] MODERATE dizziness (e.g., interferes with normal activities) AND [2] has NOT been evaluated by doctor (or NP/PA) for this  (Exception: Dizziness caused by heat exposure, sudden standing, or poor fluid intake.)  Answer Assessment - Initial Assessment Questions Pt reports one month hx of dizziness and headache. States started after dental sx in September. Was told it could have been r/t anesthesia initially, but has been worse x 1 week.  1. DESCRIPTION: Describe your dizziness.     Dizziness without spinning  2. LIGHTHEADED: Do you feel lightheaded? (e.g., somewhat faint, woozy, weak upon standing)     Worse when walking around, like in the supermarket  3. VERTIGO: Do you feel like either you or the room is spinning or tilting? (i.e., vertigo)     Denies  4. SEVERITY: Do you feel like you are going to faint? Can you stand and walk?     Denies fainting sensation, can walk  5. ONSET:  When did the dizziness begin?     One month  Protocols used: Dizziness - Lightheadedness-A-AH

## 2023-11-16 NOTE — Progress Notes (Signed)
 Patient Care Team: Georgina Speaks, FNP as PCP - General (General Practice)  Visit Date: 11/16/23  Subjective:    Patient ID: Rebekah Baker , Female   DOB: 19-Jul-1975, 48 y.o.    MRN: [id number]   48 y.o. Female presents today for dizziness. Patient has a past medical history of anxiety and depression, obesity. Feels light headed and she feels unsteady but this has never happened before   She has a dental procedure a few weeks a go and began to feel intermittent lightheadedness and unsteadiness but it has been more constant in the past five days.  She says that when she walks around in the supermarket she will become dizzy and florescent lights will also make her dizzy.  Blood pressure was 144/100 standing, 132/88 laying down and 140/94 sitting down. She has an appointment with [facility] next week for lab work.     She has been taking Mounjaro  7.5 mg weekly injections for weight loss and has so far lost 15 pounds since June.     History of Seasonal Affective disorder treated with Wellbutrin -XL 150 mg daily.   History of ADHD treated with Vyvanse  20 mg as needed per [facility].   History of Hypothyroidism treated with Cytomel  5 mcg twice daily.    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  Married with three children.      Review of Systems  Neurological:  Positive for dizziness.        Objective:   Vitals: BP (!) 140/94   Pulse 94   Temp 98.3 F (36.8 C) (Tympanic)   Ht 5' 1 (1.549 m)   Wt 170 lb (77.1 kg)   SpO2 94%   BMI 32.12 kg/m    Physical Exam Eyes:     Extraocular Movements:     Right eye: Nystagmus present.      Left eye: Nystagmus present.     Comments: Nystagmus- likely has vertigo.   Neurological:     General: No focal deficit present.     Mental Status: She is alert and oriented to person, place, and time.       Results:    Labs:       Component Value Date/Time   NA 139 11/05/2022 1008   K 4.6 11/05/2022 1008   CL 100 11/05/2022 1008   CO2 26 11/05/2022 1008   GLUCOSE 80 11/05/2022 1008   GLUCOSE 81 01/05/2019 0956   BUN 11 11/05/2022 1008   CREATININE 1.03 (H) 11/05/2022 1008   CALCIUM 9.5 11/05/2022 1008   PROT 7.3 11/05/2022 1008   ALBUMIN 4.4 11/05/2022 1008   AST 21 11/05/2022 1008   ALT 19 11/05/2022 1008   ALKPHOS 60 11/05/2022 1008   BILITOT 0.4 11/05/2022 1008     Lab Results  Component Value Date   WBC 6.2 11/05/2022   HGB 14.9 11/05/2022   HCT 45.4 11/05/2022   MCV 96 11/05/2022   PLT 325 11/05/2022    Lab Results  Component Value Date   CHOL 176 07/21/2022   HDL 59 07/21/2022  LDLCALC 105 (H) 07/21/2022   TRIG 61 07/21/2022   CHOLHDL 3.0 07/21/2022    Lab Results  Component Value Date   HGBA1C 5.1 07/21/2022     Lab Results  Component Value Date   TSH 1.590 11/05/2022         Assessment & Plan:   She has a dental procedure a few weeks a go and began to feel intermittent lightheadedness and unsteadiness but it has been more constant in the past five days.  She says that when she walks around in the supermarket she will become dizzy and florescent lights will also make her dizzy.  Blood pressure was 144/100 standing, 132/88 laying down and 140/94 sitting down. She has an appointment with [facility] next week for lab work.    Antivert 25 mg three times daily as needed for dizziness prescribed.    She is to follow up with her primary care provider.     She has been taking Mounjaro  7.5 mg weekly injections for weight loss and has so far lost 15 pounds since June.     Seasonal Affective disorder: treated with Wellbutrin -XL 150 mg  daily.   ADHD: treated with Vyvanse  20 mg as needed per [facility].   Hypothyroidism: treated with Cytomel  5 mcg twice daily.     I,Makayla C Reid,acting as a scribe for Ronal JINNY Hailstone, MD.,have documented all relevant documentation on the behalf of Ronal JINNY Hailstone, MD,as directed by  Ronal JINNY Hailstone, MD while in the presence of Ronal JINNY Hailstone, MD.

## 2023-11-18 ENCOUNTER — Ambulatory Visit: Admitting: Nurse Practitioner

## 2023-11-18 ENCOUNTER — Ambulatory Visit (INDEPENDENT_AMBULATORY_CARE_PROVIDER_SITE_OTHER): Admitting: Nurse Practitioner

## 2023-11-18 ENCOUNTER — Encounter: Payer: Self-pay | Admitting: Nurse Practitioner

## 2023-11-18 VITALS — BP 120/60 | HR 79 | Temp 99.0°F | Ht 61.0 in | Wt 170.0 lb

## 2023-11-18 DIAGNOSIS — Z8639 Personal history of other endocrine, nutritional and metabolic disease: Secondary | ICD-10-CM

## 2023-11-18 DIAGNOSIS — Z1159 Encounter for screening for other viral diseases: Secondary | ICD-10-CM | POA: Diagnosis not present

## 2023-11-18 DIAGNOSIS — Z139 Encounter for screening, unspecified: Secondary | ICD-10-CM

## 2023-11-18 DIAGNOSIS — Z13228 Encounter for screening for other metabolic disorders: Secondary | ICD-10-CM

## 2023-11-18 DIAGNOSIS — E66811 Obesity, class 1: Secondary | ICD-10-CM

## 2023-11-18 DIAGNOSIS — Z0000 Encounter for general adult medical examination without abnormal findings: Secondary | ICD-10-CM | POA: Diagnosis not present

## 2023-11-18 DIAGNOSIS — Z1211 Encounter for screening for malignant neoplasm of colon: Secondary | ICD-10-CM

## 2023-11-18 DIAGNOSIS — Z6832 Body mass index (BMI) 32.0-32.9, adult: Secondary | ICD-10-CM

## 2023-11-18 DIAGNOSIS — F419 Anxiety disorder, unspecified: Secondary | ICD-10-CM | POA: Diagnosis not present

## 2023-11-18 DIAGNOSIS — Z13220 Encounter for screening for lipoid disorders: Secondary | ICD-10-CM

## 2023-11-18 DIAGNOSIS — E6609 Other obesity due to excess calories: Secondary | ICD-10-CM

## 2023-11-18 DIAGNOSIS — F32A Depression, unspecified: Secondary | ICD-10-CM

## 2023-11-18 DIAGNOSIS — Z01419 Encounter for gynecological examination (general) (routine) without abnormal findings: Secondary | ICD-10-CM

## 2023-11-18 DIAGNOSIS — R42 Dizziness and giddiness: Secondary | ICD-10-CM | POA: Diagnosis not present

## 2023-11-18 DIAGNOSIS — Z136 Encounter for screening for cardiovascular disorders: Secondary | ICD-10-CM | POA: Diagnosis not present

## 2023-11-18 MED ORDER — ZEPBOUND 7.5 MG/0.5ML ~~LOC~~ SOAJ: 7.5000 mg | SUBCUTANEOUS | 1 refills | Status: DC

## 2023-11-18 MED ORDER — LIOTHYRONINE SODIUM 5 MCG PO TABS: 10.0000 ug | ORAL_TABLET | Freq: Every day | ORAL | 1 refills | Status: AC

## 2023-11-18 NOTE — Progress Notes (Unsigned)
 [name], CMA,acting as a neurosurgeon for Gaines Ada, FNP.,have documented all relevant documentation on the behalf of Gaines Ada, FNP,as directed by  Gaines Ada, FNP while in the presence of Gaines Ada, FNP.  Subjective:    Patient ID: Rebekah Baker , female    DOB: [birth year] , 48 y.o.   MRN: [id number]  Chief Complaint  Patient presents with   Annual Exam    Patient presents today for HM, Patient reports compliance with medication. Patient denies any chest pain, SOB, or headaches.    Dizziness    Patient reports she has been having dizziness since September after dental surgery. She had a Acute visit at another office on Monday and was told she had Benign paroxysmal positional vertigo. She was giving meclizine that is helping some.     HPI  HPI   Past Medical History:  Diagnosis Date   Abnormal uterine bleeding (AUB) 01/03/2022   Arthritis 01/20/2021   Chest pain 08/05/2022   Depression      Family History  Problem Relation Age of Onset   Hypertension Mother    Hypertension Sister    Asthma Daughter    Asthma Son    ADD / ADHD Son      Current Outpatient Medications:    clotrimazole -betamethasone  (LOTRISONE ) cream, Apply 1 Application topically 2 (two) times daily., Disp: 45 g, Rfl: 1   liothyronine (CYTOMEL ) 5 MCG tablet, Take 2 tablets by mouth once daily, Disp: 90 tablet, Rfl: 0   lisdexamfetamine (VYVANSE ) 20 MG capsule, Take 1 capsule (20 mg total) by mouth daily., Disp: , Rfl:    meclizine (ANTIVERT) 25 MG tablet, Take 1 tablet (25 mg total) by mouth 3 (three) times daily as needed for dizziness., Disp: 30 tablet, Rfl: 0   tirzepatide  (ZEPBOUND ) 7.5 MG/0.5ML Pen, Inject 7.5 mg into the skin once a week., Disp: 2 mL, Rfl: 1   WELLBUTRIN  XL 150 MG 24 hr tablet, Take 150 mg by mouth daily., Disp: , Rfl:    No Known Allergies    The patient states she uses vasectomy for birth control. Patient's last menstrual period was 09/20/2023 (approximate).   Negative for Dysmenorrhea and Negative for Menorrhagia. Negative for: breast discharge, breast lump(s), breast pain and breast self exam. Associated symptoms include abnormal vaginal bleeding. Pertinent negatives include abnormal bleeding (hematology), anxiety, decreased libido, depression, difficulty falling sleep, dyspareunia, history of infertility, nocturia, sexual dysfunction, sleep disturbances, urinary incontinence, urinary urgency, vaginal discharge and vaginal itching. Diet regular; she is happy with how she is eating. No big appetite gaps. She is not able to skip breakfast anymore. Eating mostly protein and salad. She is eating some carbs but does not over eat them. The patient states her exercise level is moderate - [place] - consistently daily for short periods.   The patient's tobacco use is:  Social History   Tobacco Use  Smoking Status Never  Smokeless Tobacco Never  . She has been exposed to passive smoke. The patient's alcohol use is:  Social History   Substance and Sexual Activity  Alcohol Use Not Currently  . Additional information: Last pap ***, next one scheduled for ***.    Review of Systems  Constitutional: Negative.   Respiratory: Negative.    Cardiovascular: Negative.   Neurological: Negative.   Psychiatric/Behavioral: Negative.       Today's Vitals   11/18/23 1519  BP: 120/60  Pulse: 79  Temp: 99 F (37.2 C)  TempSrc: Oral  Weight: 170 lb (77.1  kg)  Height: 5' 1 (1.549 m)  PainSc: 0-No pain   Body mass index is 32.12 kg/m.  Wt Readings from Last 3 Encounters:  11/18/23 170 lb (77.1 kg)  11/16/23 170 lb (77.1 kg)  08/26/23 174 lb (78.9 kg)     Objective:  Physical Exam Vitals and nursing note reviewed.  Constitutional:      General: She is not in acute distress.    Appearance: Normal appearance. She is well-developed. She is obese.  HENT:     Head: Normocephalic and atraumatic.     Right Ear: Hearing, tympanic membrane, ear canal and  external ear normal. There is no impacted cerumen.     Left Ear: Hearing, tympanic membrane, ear canal and external ear normal. There is no impacted cerumen.     Nose: Nose normal.     Mouth/Throat:     Mouth: Mucous membranes are moist.  Eyes:     General: Lids are normal.     Extraocular Movements: Extraocular movements intact.     Conjunctiva/sclera: Conjunctivae normal.     Pupils: Pupils are equal, round, and reactive to light.     Funduscopic exam:    Right eye: No papilledema.        Left eye: No papilledema.  Neck:     Thyroid : No thyroid  mass.     Vascular: No carotid bruit.  Cardiovascular:     Rate and Rhythm: Normal rate and regular rhythm.     Pulses: Normal pulses.     Heart sounds: Normal heart sounds. No murmur heard. Pulmonary:     Effort: Pulmonary effort is normal. No respiratory distress.     Breath sounds: Normal breath sounds. No wheezing.  Chest:     Chest wall: No mass.  Breasts:    Tanner Score is 5.     Right: Normal. No mass or tenderness.     Left: Normal. No mass or tenderness.  Abdominal:     General: Abdomen is flat. Bowel sounds are normal. There is no distension.     Palpations: Abdomen is soft.     Tenderness: There is no abdominal tenderness.  Genitourinary:    Comments: Deferred - followed by GYN Musculoskeletal:        General: No swelling or tenderness. Normal range of motion.     Cervical back: Full passive range of motion without pain, normal range of motion and neck supple.     Right lower leg: No edema.     Left lower leg: No edema.  Lymphadenopathy:     Upper Body:     Right upper body: No supraclavicular, axillary or pectoral adenopathy.     Left upper body: No supraclavicular, axillary or pectoral adenopathy.  Skin:    General: Skin is warm and dry.     Capillary Refill: Capillary refill takes less than 2 seconds.  Neurological:     General: No focal deficit present.     Mental Status: She is alert and oriented to person,  place, and time.     Cranial Nerves: No cranial nerve deficit.     Sensory: No sensory deficit.     Motor: No weakness.  Psychiatric:        Mood and Affect: Mood normal.        Behavior: Behavior normal.        Thought Content: Thought content normal.        Judgment: Judgment normal.        11/16/2023  4:39 PM 07/06/2023   11:39 AM 11/05/2022    9:25 AM 07/21/2022   10:29 AM 06/05/2022   10:36 AM  Depression screen PHQ 2/9  Decreased Interest 0 0 0 0 0  Down, Depressed, Hopeless 0 0 0 0 0  PHQ - 2 Score 0 0 0 0 0  Altered sleeping  0 0 0 0  Tired, decreased energy  0 0 0 0  Change in appetite  0 0 0 0  Feeling bad or failure about yourself   0 0 0 0  Trouble concentrating  0 0 0 0  Moving slowly or fidgety/restless  0 0 0 0  Suicidal thoughts  0 0 0 0  PHQ-9 Score  0 0 0 0  Difficult doing work/chores  Not difficult at all Not difficult at all Not difficult at all Not difficult at all      11/16/2023    4:39 PM 11/05/2022    9:25 AM 07/21/2022   10:29 AM 05/29/2022    8:45 AM  GAD 7 : Generalized Anxiety Score  Nervous, Anxious, on Edge 0 0 0 0  Control/stop worrying 0 0 0 0  Worry too much - different things 0 0 0 0  Trouble relaxing 0 0 0 1  Restless 0 0 0 0  Easily annoyed or irritable 0 0 0 1  Afraid - awful might happen 0 0 0 0  Total GAD 7 Score 0 0 0 2  Anxiety Difficulty Not difficult at all Not difficult at all Not difficult at all Not difficult at all    Assessment And Plan:     Encounter for annual health examination  Anxiety and depression -     CMP14+EGFR  History of hypothyroidism -     TSH + free T4  Class 1 obesity due to excess calories without serious comorbidity with body mass index (BMI) of 32.0 to 32.9 in adult  Encounter for lipid screening for cardiovascular disease -     Lipid panel  Encounter for screening -     Hepatitis B surface antibody,qualitative  Colon cancer screening -     Cologuard; Future  Encounter for  gynecological examination -     Ambulatory referral to Gynecology  Vertigo -     CBC with Differential/Platelet  Encounter for screening for metabolic disorder -     Hemoglobin A1c  Encounter for hepatitis C screening test for low risk patient -     Hepatitis C antibody     Return for 1 year physical, 2 months weight check. Patient was given opportunity to ask questions. Patient verbalized understanding of the plan and was able to repeat key elements of the plan. All questions were answered to their satisfaction.   Gaines Ada, FNP  I, Gaines Ada, FNP, have reviewed all documentation for this visit. The documentation on 11/18/23 for the exam, diagnosis, procedures, and orders are all accurate and complete.

## 2023-11-18 NOTE — Patient Instructions (Signed)
 Health Maintenance  Topic Date Due   HIV Screening  Never done   Hepatitis C Screening  Never done   DTaP/Tdap/Td vaccine (1 - Tdap) Never done   Hepatitis B Vaccine (1 of 3 - 19+ 3-dose series) Never done   Colon Cancer Screening  Never done   Pap with HPV screening  11/11/2023   COVID-19 Vaccine (4 - 2025-26 season) 12/04/2023*   Flu Shot  04/19/2024*   Breast Cancer Screening  02/13/2024   Pneumococcal Vaccine  Aged Out   HPV Vaccine  Aged Out   Meningitis B Vaccine  Aged Out  *Topic was postponed. The date shown is not the original due date.

## 2023-11-19 ENCOUNTER — Encounter: Payer: Self-pay | Admitting: Internal Medicine

## 2023-11-19 LAB — CBC WITH DIFFERENTIAL/PLATELET
Basophils Absolute: 0 x10E3/uL (ref 0.0–0.2)
Basos: 1 %
EOS (ABSOLUTE): 0.1 x10E3/uL (ref 0.0–0.4)
Eos: 3 %
Hematocrit: 45.4 % (ref 34.0–46.6)
Hemoglobin: 14.6 g/dL (ref 11.1–15.9)
Immature Grans (Abs): 0 x10E3/uL (ref 0.0–0.1)
Immature Granulocytes: 0 %
Lymphocytes Absolute: 2 x10E3/uL (ref 0.7–3.1)
Lymphs: 40 %
MCH: 31.4 pg (ref 26.6–33.0)
MCHC: 32.2 g/dL (ref 31.5–35.7)
MCV: 98 fL — ABNORMAL HIGH (ref 79–97)
Monocytes Absolute: 0.4 x10E3/uL (ref 0.1–0.9)
Monocytes: 9 %
Neutrophils Absolute: 2.4 x10E3/uL (ref 1.4–7.0)
Neutrophils: 47 %
Platelets: 257 x10E3/uL (ref 150–450)
RBC: 4.65 x10E6/uL (ref 3.77–5.28)
RDW: 12.5 % (ref 11.7–15.4)
WBC: 5.1 x10E3/uL (ref 3.4–10.8)

## 2023-11-19 LAB — TSH+FREE T4
Free T4: 1.01 ng/dL (ref 0.82–1.77)
TSH: 1.53 u[IU]/mL (ref 0.450–4.500)

## 2023-11-19 LAB — CMP14+EGFR
ALT: 13 IU/L (ref 0–32)
AST: 18 IU/L (ref 0–40)
Albumin: 4.6 g/dL (ref 3.9–4.9)
Alkaline Phosphatase: 56 IU/L (ref 41–116)
BUN/Creatinine Ratio: 13 (ref 9–23)
BUN: 11 mg/dL (ref 6–24)
Bilirubin Total: <0.2 mg/dL (ref 0.0–1.2)
CO2: 26 mmol/L (ref 20–29)
Calcium: 9.9 mg/dL (ref 8.7–10.2)
Chloride: 101 mmol/L (ref 96–106)
Creatinine, Ser: 0.88 mg/dL (ref 0.57–1.00)
Globulin, Total: 2.8 g/dL (ref 1.5–4.5)
Glucose: 67 mg/dL — ABNORMAL LOW (ref 70–99)
Potassium: 4.2 mmol/L (ref 3.5–5.2)
Sodium: 139 mmol/L (ref 134–144)
Total Protein: 7.4 g/dL (ref 6.0–8.5)
eGFR: 81 mL/min/1.73 (ref >59)

## 2023-11-19 LAB — LIPID PANEL
Chol/HDL Ratio: 2.7 ratio (ref 0.0–4.4)
Cholesterol, Total: 218 mg/dL — ABNORMAL HIGH (ref 100–199)
HDL: 80 mg/dL (ref >39)
LDL Chol Calc (NIH): 128 mg/dL — ABNORMAL HIGH (ref 0–99)
Triglycerides: 57 mg/dL (ref 0–149)
VLDL Cholesterol Cal: 10 mg/dL (ref 5–40)

## 2023-11-19 LAB — HEPATITIS C ANTIBODY: Hep C Virus Ab: NONREACTIVE

## 2023-11-19 LAB — HEMOGLOBIN A1C
Est. average glucose Bld gHb Est-mCnc: 100 mg/dL
Hgb A1c MFr Bld: 5.1 % (ref 4.8–5.6)

## 2023-11-19 LAB — HEPATITIS B SURFACE ANTIBODY,QUALITATIVE: Hep B Surface Ab, Qual: NONREACTIVE

## 2023-11-19 NOTE — Patient Instructions (Signed)
 Take Antivert 25 mg up to 3 times daily as needed for vertigo. Rest and stay well hydrated. Is to have labs next week per Primary Care Provider. Careful with driving. Symptoms should improve within 3-5 days.

## 2023-11-20 ENCOUNTER — Ambulatory Visit: Payer: Self-pay | Admitting: Nurse Practitioner

## 2023-11-20 DIAGNOSIS — R42 Dizziness and giddiness: Secondary | ICD-10-CM | POA: Insufficient documentation

## 2023-11-20 NOTE — Assessment & Plan Note (Signed)

## 2023-11-20 NOTE — Assessment & Plan Note (Signed)
 Managing obesity with dietary changes and regular exercise. Satisfied with current routine including daily Peloton use and balanced diet. - Continue current diet and exercise regimen.

## 2023-11-20 NOTE — Assessment & Plan Note (Signed)
 According to USPTF Colorectal cancer Screening guidelines. Cologuard is recommended every 3 years, starting at age 49 years. Order for cologuard sent

## 2023-11-20 NOTE — Assessment & Plan Note (Signed)
 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 Meclizine causes drowsiness, limiting use. Blood pressure controlled, ruling out hypertension. Differential includes inner ear issues or atypical migraine. - Continue meclizine as prescribed. - Monitor blood pressure at home. - Order lab tests to check thyroid  function. - Consider ENT referral if symptoms persist. - Consider physical therapy for vertigo management. - Consider Toradol  or steroid injection if symptoms worsen. - Advise taking acetaminophen instead of ibuprofen  for headache.

## 2023-11-20 NOTE — Assessment & Plan Note (Signed)
 Continue wellbutrin, tolerating well

## 2023-11-25 DIAGNOSIS — M629 Disorder of muscle, unspecified: Secondary | ICD-10-CM | POA: Diagnosis not present

## 2023-11-30 ENCOUNTER — Ambulatory Visit: Admitting: Nurse Practitioner

## 2023-12-07 DIAGNOSIS — F902 Attention-deficit hyperactivity disorder, combined type: Secondary | ICD-10-CM | POA: Diagnosis not present

## 2023-12-07 DIAGNOSIS — F4011 Social phobia, generalized: Secondary | ICD-10-CM | POA: Diagnosis not present

## 2023-12-08 DIAGNOSIS — M629 Disorder of muscle, unspecified: Secondary | ICD-10-CM | POA: Diagnosis not present

## 2023-12-10 DIAGNOSIS — N951 Menopausal and female climacteric states: Secondary | ICD-10-CM | POA: Diagnosis not present

## 2023-12-10 DIAGNOSIS — E039 Hypothyroidism, unspecified: Secondary | ICD-10-CM | POA: Diagnosis not present

## 2023-12-14 DIAGNOSIS — F4011 Social phobia, generalized: Secondary | ICD-10-CM | POA: Diagnosis not present

## 2023-12-14 DIAGNOSIS — F902 Attention-deficit hyperactivity disorder, combined type: Secondary | ICD-10-CM | POA: Diagnosis not present

## 2024-01-05 NOTE — Progress Notes (Addendum)
 "  Virtual Visit via Video Note  [name], CMA,acting as a scribe for Gaines Ada, FNP.,have documented all relevant documentation on the behalf of Gaines Ada, FNP,as directed by  Gaines Ada, FNP while in the presence of Gaines Ada, FNP.  I connected with Rebekah Baker on 01/17/2024 at  4:00 PM EST by a video enabled telemedicine application and verified that I am speaking with the correct person using two identifiers.  Patient Location: Home Provider Location: Office/Clinic  I discussed the limitations, risks, security, and privacy concerns of performing an evaluation and management service by video and the availability of in person appointments. I also discussed with the patient that there may be a patient responsible charge related to this service. The patient expressed understanding and agreed to proceed.  Subjective: PCP: Ada Gaines, FNP  Chief Complaint  Patient presents with   Obesity    Patient presents today for a weight follow up, Patient reports compliance with medication. Patient denies any chest pain, SOB, or headaches. Patient has no concerns today.    She is on 7.5mg  Zepbound , eating is good, she is eating at least breakfast, lunch (usually very hungry) and dinner. She is snacking in between, dried fruit and granola. When she does not eat snacks or drink enough water. Exercising with her [place] 3-4 days a week.      ROS: Per HPI  Current Outpatient Medications:    clotrimazole -betamethasone  (LOTRISONE ) cream, Apply 1 Application topically 2 (two) times daily., Disp: 45 g, Rfl: 1   liothyronine  (CYTOMEL ) 5 MCG tablet, Take 2 tablets (10 mcg total) by mouth daily., Disp: 180 tablet, Rfl: 1   lisdexamfetamine  (VYVANSE ) 20 MG capsule, Take 1 capsule (20 mg total) by mouth daily., Disp: , Rfl:    meclizine  (ANTIVERT ) 25 MG tablet, Take 1 tablet (25 mg total) by mouth 3 (three) times daily as needed for dizziness., Disp: 30 tablet, Rfl: 0   tirzepatide   (ZEPBOUND ) 10 MG/0.5ML Pen, Inject 10 mg into the skin once a week., Disp: 2 mL, Rfl: 1   WELLBUTRIN  XL 150 MG 24 hr tablet, Take 150 mg by mouth daily., Disp: , Rfl:   Observations/Objective: Today's Vitals   01/06/24 1549  Weight: 168 lb (76.2 kg)  Height: 5' 1 (1.549 m)  Body mass index is 31.74 kg/m.  Wt Readings from Last 3 Encounters:  01/06/24 168 lb (76.2 kg)  11/18/23 170 lb (77.1 kg)  11/16/23 170 lb (77.1 kg)    Physical Exam Vitals reviewed.  Constitutional:      General: She is not in acute distress.    Appearance: Normal appearance. She is obese.  Pulmonary:     Effort: Pulmonary effort is normal. No respiratory distress.  Neurological:     General: No focal deficit present.     Mental Status: She is alert and oriented to person, place, and time.     Cranial Nerves: No cranial nerve deficit.  Psychiatric:        Mood and Affect: Mood normal.        Behavior: Behavior normal.        Thought Content: Thought content normal.        Judgment: Judgment normal.     Assessment and Plan: Class 1 obesity due to excess calories with body mass index (BMI) of 31.0 to 31.9 in adult, unspecified whether serious comorbidity present Assessment & Plan: She had a 2 lb weight loss since her last visit. She will continue her current dose  of zepbound  10 mg, she will make me aware at the beginning of the year in the event we want to switch over to oral semaglatide.  Orders: -     Zepbound ; Inject 10 mg into the skin once a week.  Dispense: 2 mL; Refill: 1    Follow Up Instructions: Return for 2 months weight check.   I discussed the assessment and treatment plan with the patient. The patient was provided an opportunity to ask questions, and all were answered. The patient agreed with the plan and demonstrated an understanding of the instructions.   The patient was advised to call back or seek an in-person evaluation if the symptoms worsen or if the condition fails to  improve as anticipated.  The above assessment and management plan was discussed with the patient. The patient verbalized understanding of and has agreed to the management plan.   LILLETTE Gaines Ada, FNP, have reviewed all documentation for this visit. The documentation on 01/06/2024 for the exam, diagnosis, procedures, and orders are all accurate and complete.   "

## 2024-01-06 ENCOUNTER — Encounter: Payer: Self-pay | Admitting: Nurse Practitioner

## 2024-01-06 ENCOUNTER — Telehealth: Payer: Self-pay | Admitting: Nurse Practitioner

## 2024-01-06 VITALS — Ht 61.0 in | Wt 168.0 lb

## 2024-01-06 DIAGNOSIS — E66811 Obesity, class 1: Secondary | ICD-10-CM | POA: Diagnosis not present

## 2024-01-06 DIAGNOSIS — Z6831 Body mass index (BMI) 31.0-31.9, adult: Secondary | ICD-10-CM | POA: Diagnosis not present

## 2024-01-06 DIAGNOSIS — E6609 Other obesity due to excess calories: Secondary | ICD-10-CM

## 2024-01-06 MED ORDER — ZEPBOUND 10 MG/0.5ML ~~LOC~~ SOAJ: 10.0000 mg | SUBCUTANEOUS | 1 refills | Status: AC

## 2024-01-17 NOTE — Assessment & Plan Note (Signed)
 She had a 2 lb weight loss since her last visit. She will continue her current dose of zepbound  10 mg, she will make me aware at the beginning of the year in the event we want to switch over to oral semaglatide.

## 2024-03-08 ENCOUNTER — Ambulatory Visit: Payer: Self-pay | Admitting: Nurse Practitioner

## 2024-03-09 ENCOUNTER — Encounter: Admitting: Nurse Practitioner

## 2024-11-21 ENCOUNTER — Encounter: Payer: Self-pay | Admitting: Nurse Practitioner
